# Patient Record
Sex: Male | Born: 1960 | Race: White | Hispanic: No | Marital: Married | Smoking: Former smoker
Health system: Southern US, Community
[De-identification: ages and names within clinical notes are randomized; demographics above are authoritative.]

## PROBLEM LIST (undated history)

## (undated) DIAGNOSIS — I10 Essential (primary) hypertension: Secondary | ICD-10-CM

## (undated) DIAGNOSIS — Z87442 Personal history of urinary calculi: Secondary | ICD-10-CM

## (undated) DIAGNOSIS — G629 Polyneuropathy, unspecified: Secondary | ICD-10-CM

## (undated) DIAGNOSIS — H3500 Unspecified background retinopathy: Secondary | ICD-10-CM

## (undated) DIAGNOSIS — N189 Chronic kidney disease, unspecified: Secondary | ICD-10-CM

## (undated) DIAGNOSIS — E119 Type 2 diabetes mellitus without complications: Secondary | ICD-10-CM

## (undated) DIAGNOSIS — I639 Cerebral infarction, unspecified: Secondary | ICD-10-CM

## (undated) DIAGNOSIS — F419 Anxiety disorder, unspecified: Secondary | ICD-10-CM

## (undated) HISTORY — PX: TONSILLECTOMY: SUR1361

## (undated) HISTORY — PX: LITHOTRIPSY: SUR834

---

## 1997-08-31 HISTORY — PX: SHOULDER ARTHROSCOPY: SHX128

## 1999-10-07 ENCOUNTER — Encounter: Payer: Self-pay | Admitting: Orthopaedic Surgery

## 1999-10-07 ENCOUNTER — Ambulatory Visit (HOSPITAL_COMMUNITY): Admission: RE | Admit: 1999-10-07 | Discharge: 1999-10-07 | Payer: Self-pay | Admitting: Orthopaedic Surgery

## 2014-10-29 NOTE — Patient Instructions (Signed)
Your procedure is scheduled on: 11/05/2014  [facility] to [facility] at  615  AM.  Call this number if you have problems the morning of surgery: [phone]   Do not eat food or drink liquids :After Midnight.      Take these medicines the morning of surgery with A SIP OF WATER: valium, lisinopril, metoprolol   Do not wear jewelry, make-up or nail polish.  Do not wear lotions, powders, or perfumes.  Do not shave 48 hours prior to surgery.  Do not bring valuables to the hospital.  Contacts, dentures or bridgework may not be worn into surgery.  Leave suitcase in the car. After surgery it may be brought to your room.  For patients admitted to the hospital, checkout time is 11:00 AM the day of discharge.   Patients discharged the day of surgery will not be allowed to drive home.  :     Please read over the following fact sheets that you were given: Coughing and Deep Breathing, Surgical Site Infection Prevention, Anesthesia Post-op Instructions and Care and Recovery After Surgery    Cataract A cataract is a clouding of the lens of the eye. When a lens becomes cloudy, vision is reduced based on the degree and nature of the clouding. Many cataracts reduce vision to some degree. Some cataracts make people more near-sighted as they develop. Other cataracts increase glare. Cataracts that are ignored and become worse can sometimes look white. The white color can be seen through the pupil. CAUSES   Aging. However, cataracts may occur at any age, even in newborns.   Certain drugs.   Trauma to the eye.   Certain diseases such as diabetes.   Specific eye diseases such as chronic inflammation inside the eye or a sudden attack of a rare form of glaucoma.   Inherited or acquired medical problems.  SYMPTOMS   Gradual, progressive drop in vision in the affected eye.   Severe, rapid visual loss. This most often happens when trauma is the cause.  DIAGNOSIS  To detect a cataract, an eye doctor examines  the lens. Cataracts are best diagnosed with an exam of the eyes with the pupils enlarged (dilated) by drops.  TREATMENT  For an early cataract, vision may improve by using different eyeglasses or stronger lighting. If that does not help your vision, surgery is the only effective treatment. A cataract needs to be surgically removed when vision loss interferes with your everyday activities, such as driving, reading, or watching TV. A cataract may also have to be removed if it prevents examination or treatment of another eye problem. Surgery removes the cloudy lens and usually replaces it with a substitute lens (intraocular lens, IOL).  At a time when both you and your doctor agree, the cataract will be surgically removed. If you have cataracts in both eyes, only one is usually removed at a time. This allows the operated eye to heal and be out of danger from any possible problems after surgery (such as infection or poor wound healing). In rare cases, a cataract may be doing damage to your eye. In these cases, your caregiver may advise surgical removal right away. The vast majority of people who have cataract surgery have better vision afterward. HOME CARE INSTRUCTIONS  If you are not planning surgery, you may be asked to do the following:  Use different eyeglasses.   Use stronger or brighter lighting.   Ask your eye doctor about reducing your medicine dose or changing medicines  if it is thought that a medicine caused your cataract. Changing medicines does not make the cataract go away on its own.   Become familiar with your surroundings. Poor vision can lead to injury. Avoid bumping into things on the affected side. You are at a higher risk for tripping or falling.   Exercise extreme care when driving or operating machinery.   Wear sunglasses if you are sensitive to bright light or experiencing problems with glare.  SEEK IMMEDIATE MEDICAL CARE IF:   You have a worsening or sudden vision loss.    You notice redness, swelling, or increasing pain in the eye.   You have a fever.  Document Released: 08/17/2005 Document Revised: 08/06/2011 Document Reviewed: 04/10/2011 [facility] Patient Information 2012 [place].PATIENT INSTRUCTIONS POST-ANESTHESIA  IMMEDIATELY FOLLOWING SURGERY:  Do not drive or operate machinery for the first twenty four hours after surgery.  Do not make any important decisions for twenty four hours after surgery or while taking narcotic pain medications or sedatives.  If you develop intractable nausea and vomiting or a severe headache please notify your doctor immediately.  FOLLOW-UP:  Please make an appointment with your surgeon as instructed. You do not need to follow up with anesthesia unless specifically instructed to do so.  WOUND CARE INSTRUCTIONS (if applicable):  Keep a dry clean dressing on the anesthesia/puncture wound site if there is drainage.  Once the wound has quit draining you may leave it open to air.  Generally you should leave the bandage intact for twenty four hours unless there is drainage.  If the epidural site drains for more than 36-48 hours please call the anesthesia department.  QUESTIONS?:  Please feel free to call your physician or the hospital operator if you have any questions, and they will be happy to assist you.

## 2014-10-30 ENCOUNTER — Encounter (HOSPITAL_COMMUNITY): Payer: Self-pay

## 2014-10-30 ENCOUNTER — Encounter (HOSPITAL_COMMUNITY)
Admission: RE | Admit: 2014-10-30 | Discharge: 2014-10-30 | Disposition: A | Discharge status: Home / Self Care | Source: Ambulatory Visit | Attending: Ophthalmology | Admitting: Ophthalmology

## 2014-10-30 ENCOUNTER — Other Ambulatory Visit: Payer: Self-pay

## 2014-10-30 DIAGNOSIS — Z539 Procedure and treatment not carried out, unspecified reason: Secondary | ICD-10-CM | POA: Insufficient documentation

## 2014-10-30 DIAGNOSIS — Z01810 Encounter for preprocedural cardiovascular examination: Secondary | ICD-10-CM | POA: Insufficient documentation

## 2014-10-30 HISTORY — DX: Unspecified background retinopathy: H35.00

## 2014-10-30 HISTORY — DX: Essential (primary) hypertension: I10

## 2014-10-30 HISTORY — DX: Anxiety disorder, unspecified: F41.9

## 2014-10-30 HISTORY — DX: Personal history of urinary calculi: Z87.442

## 2014-10-30 NOTE — Pre-Procedure Instructions (Signed)
Patient given information to sign up for my chart at home. 

## 2014-11-01 NOTE — Pre-Procedure Instructions (Addendum)
[facility] was [facility] with a HGB A1C of 10.6.  Patient is on medication for this.  Discussed with Dr Jayme CloudGonzalez., who advised to contact Dr Lita MainsHaines with above information.  Per Dr Lita MainsHaines, he will contact his medical doctor and will proceed with surgery with a glucose to be done the day of procedure.

## 2014-11-05 ENCOUNTER — Encounter (HOSPITAL_COMMUNITY): Admission: RE | Payer: Self-pay | Source: Ambulatory Visit

## 2014-11-05 ENCOUNTER — Ambulatory Visit (HOSPITAL_COMMUNITY): Admission: RE | Admit: 2014-11-05 | Source: Ambulatory Visit | Admitting: Ophthalmology

## 2014-11-05 SURGERY — PHACOEMULSIFICATION, CATARACT, WITH IOL INSERTION
Anesthesia: Monitor Anesthesia Care | Laterality: Left

## 2014-11-29 ENCOUNTER — Encounter (HOSPITAL_COMMUNITY)
Admission: RE | Admit: 2014-11-29 | Discharge: 2014-11-29 | Disposition: A | Discharge status: Home / Self Care | Source: Ambulatory Visit | Attending: Ophthalmology | Admitting: Ophthalmology

## 2014-11-29 ENCOUNTER — Encounter (HOSPITAL_COMMUNITY): Payer: Self-pay

## 2014-12-03 ENCOUNTER — Ambulatory Visit (HOSPITAL_COMMUNITY): Admitting: Anesthesiology

## 2014-12-03 ENCOUNTER — Ambulatory Visit (HOSPITAL_COMMUNITY)
Admission: RE | Admit: 2014-12-03 | Discharge: 2014-12-03 | Disposition: A | Discharge status: Home / Self Care | Source: Ambulatory Visit | Attending: Ophthalmology | Admitting: Ophthalmology

## 2014-12-03 ENCOUNTER — Encounter (HOSPITAL_COMMUNITY): Payer: Self-pay

## 2014-12-03 ENCOUNTER — Encounter (HOSPITAL_COMMUNITY)
Admission: RE | Disposition: A | Discharge status: Home / Self Care | Payer: Self-pay | Source: Ambulatory Visit | Attending: Ophthalmology

## 2014-12-03 DIAGNOSIS — Z87891 Personal history of nicotine dependence: Secondary | ICD-10-CM | POA: Insufficient documentation

## 2014-12-03 DIAGNOSIS — E119 Type 2 diabetes mellitus without complications: Secondary | ICD-10-CM | POA: Diagnosis not present

## 2014-12-03 DIAGNOSIS — I10 Essential (primary) hypertension: Secondary | ICD-10-CM | POA: Insufficient documentation

## 2014-12-03 DIAGNOSIS — F419 Anxiety disorder, unspecified: Secondary | ICD-10-CM | POA: Diagnosis not present

## 2014-12-03 DIAGNOSIS — H2512 Age-related nuclear cataract, left eye: Secondary | ICD-10-CM | POA: Insufficient documentation

## 2014-12-03 HISTORY — PX: CATARACT EXTRACTION W/PHACO: SHX586

## 2014-12-03 HISTORY — DX: Type 2 diabetes mellitus without complications: E11.9

## 2014-12-03 LAB — GLUCOSE, CAPILLARY: Glucose-Capillary: 154 mg/dL — ABNORMAL HIGH (ref 70–99)

## 2014-12-03 SURGERY — PHACOEMULSIFICATION, CATARACT, WITH IOL INSERTION
Anesthesia: Monitor Anesthesia Care | Site: Eye | Laterality: Left

## 2014-12-03 MED ORDER — MIDAZOLAM HCL 2 MG/2ML IJ SOLN
INTRAMUSCULAR | Status: AC
  Filled 2014-12-03: qty 2

## 2014-12-03 MED ORDER — MIDAZOLAM HCL 5 MG/5ML IJ SOLN
INTRAMUSCULAR | Status: DC | PRN
  Administered 2014-12-03: 1 mg via INTRAVENOUS
  Administered 2014-12-03: 2 mg via INTRAVENOUS
  Administered 2014-12-03: 1 mg via INTRAVENOUS

## 2014-12-03 MED ORDER — PHENYLEPHRINE-KETOROLAC 1-0.3 % IO SOLN
INTRAOCULAR | Status: DC | PRN
  Administered 2014-12-03: 500 mL via OPHTHALMIC

## 2014-12-03 MED ORDER — LABETALOL HCL 5 MG/ML IV SOLN
10.0000 mg | Freq: Once | INTRAVENOUS | Status: AC
  Administered 2014-12-03 (×2): 5 mg via INTRAVENOUS

## 2014-12-03 MED ORDER — POVIDONE-IODINE 5 % OP SOLN
OPHTHALMIC | Status: DC | PRN
  Administered 2014-12-03: 1 via OPHTHALMIC

## 2014-12-03 MED ORDER — LABETALOL HCL 5 MG/ML IV SOLN
INTRAVENOUS | Status: AC
  Filled 2014-12-03: qty 4

## 2014-12-03 MED ORDER — LIDOCAINE HCL 3.5 % OP GEL
OPHTHALMIC | Status: DC | PRN
  Administered 2014-12-03: 1 via OPHTHALMIC

## 2014-12-03 MED ORDER — TRYPAN BLUE 0.06 % OP SOLN
OPHTHALMIC | Status: DC | PRN
  Administered 2014-12-03: .25 mL via INTRAOCULAR

## 2014-12-03 MED ORDER — FENTANYL CITRATE 0.05 MG/ML IJ SOLN
25.0000 ug | INTRAMUSCULAR | Status: AC
Start: 2014-12-03 — End: 2014-12-03
  Administered 2014-12-03 (×2): 25 ug via INTRAVENOUS

## 2014-12-03 MED ORDER — LACTATED RINGERS IV SOLN
INTRAVENOUS | Status: DC
  Administered 2014-12-03: 08:00:00 via INTRAVENOUS

## 2014-12-03 MED ORDER — TETRACAINE HCL 0.5 % OP SOLN
OPHTHALMIC | Status: AC
  Filled 2014-12-03: qty 2

## 2014-12-03 MED ORDER — FENTANYL CITRATE 0.05 MG/ML IJ SOLN
INTRAMUSCULAR | Status: AC
  Filled 2014-12-03: qty 2

## 2014-12-03 MED ORDER — TRYPAN BLUE 0.06 % OP SOLN
OPHTHALMIC | Status: AC
  Filled 2014-12-03: qty 0.5

## 2014-12-03 MED ORDER — CYCLOPENTOLATE-PHENYLEPHRINE OP SOLN OPTIME - NO CHARGE
OPHTHALMIC | Status: AC
  Filled 2014-12-03: qty 2

## 2014-12-03 MED ORDER — TETRACAINE 0.5 % OP SOLN OPTIME - NO CHARGE
OPHTHALMIC | Status: DC | PRN
  Administered 2014-12-03: 1 [drp] via OPHTHALMIC

## 2014-12-03 MED ORDER — MIDAZOLAM HCL 2 MG/2ML IJ SOLN
1.0000 mg | INTRAMUSCULAR | Status: DC | PRN
  Administered 2014-12-03: 2 mg via INTRAVENOUS
  Filled 2014-12-03: qty 2

## 2014-12-03 MED ORDER — NA HYALUR & NA CHOND-NA HYALUR 0.55-0.5 ML IO KIT
PACK | INTRAOCULAR | Status: DC | PRN
  Administered 2014-12-03: 1 via OPHTHALMIC

## 2014-12-03 MED ORDER — BSS IO SOLN
INTRAOCULAR | Status: DC | PRN
  Administered 2014-12-03: 15 mL via INTRAOCULAR

## 2014-12-03 MED ORDER — LIDOCAINE HCL 3.5 % OP GEL
OPHTHALMIC | Status: AC
  Filled 2014-12-03: qty 1

## 2014-12-03 SURGICAL SUPPLY — 8 items
CLOTH BEACON ORANGE TIMEOUT ST (SAFETY) ×1 IMPLANT
GLOVE BIOGEL PI IND STRL 6.5 (GLOVE) IMPLANT
GLOVE BIOGEL PI INDICATOR 6.5 (GLOVE) ×1
GLOVE EXAM NITRILE MD LF STRL (GLOVE) ×1 IMPLANT
INST SET CATARACT ~~LOC~~ (KITS) ×2 IMPLANT
LENS ALC ACRYL/TECN (Ophthalmic Related) ×2 IMPLANT
PAD ARMBOARD 7.5X6 YLW CONV (MISCELLANEOUS) ×1 IMPLANT
WATER STERILE IRR 250ML POUR (IV SOLUTION) ×1 IMPLANT

## 2014-12-03 NOTE — Anesthesia Preprocedure Evaluation (Signed)
Anesthesia Evaluation  Patient identified by MRN, date of birth, ID band Patient awake    Reviewed: Allergy & Precautions, NPO status , Patient's Chart, lab work & pertinent test results  Airway Mallampati: I  TM Distance: <3 FB Neck ROM: Full    Dental  (+) Teeth Intact   Pulmonary former smoker,  breath sounds clear to auscultation        Cardiovascular hypertension, Pt. on medications Rhythm:Regular Rate:Normal     Neuro/Psych PSYCHIATRIC DISORDERS Anxiety    GI/Hepatic   Endo/Other  diabetes, Type 2, Oral Hypoglycemic Agents  Renal/GU      Musculoskeletal   Abdominal   Peds  Hematology   Anesthesia Other Findings   Reproductive/Obstetrics                             Anesthesia Physical Anesthesia Plan  ASA: III  Anesthesia Plan: MAC   Post-op Pain Management:    Induction: Intravenous  Airway Management Planned: Nasal Cannula  Additional Equipment:   Intra-op Plan:   Post-operative Plan:   Informed Consent: I have reviewed the patients History and Physical, chart, labs and discussed the procedure including the risks, benefits and alternatives for the proposed anesthesia with the patient or authorized representative who has indicated his/her understanding and acceptance.     Plan Discussed with:   Anesthesia Plan Comments:         Anesthesia Quick Evaluation

## 2014-12-03 NOTE — Transfer of Care (Signed)
Immediate Anesthesia Transfer of Care Note  Patient: Jeff Silva  Procedure(s) Performed: Procedure(s) with comments: CATARACT EXTRACTION PHACO AND INTRAOCULAR LENS PLACEMENT (IOC) (Left) - CDE:23.07  Patient Location: Short Stay  Anesthesia Type:MAC  Level of Consciousness: awake, alert , oriented and patient cooperative  Airway & Oxygen Therapy: Patient Spontanous Breathing  Post-op Assessment: Report given to RN, Post -op Vital signs reviewed and stable and Patient moving all extremities  Post vital signs: Reviewed and stable  Last Vitals:  Filed Vitals:   12/03/14 0825  BP: 199/95  Pulse:   Temp:   Resp: 0    Complications: No apparent anesthesia complications

## 2014-12-03 NOTE — Op Note (Signed)
12/03/2014  9:14 AM  PATIENT:  Jeff Silva  54 y.o. male  PRE-OPERATIVE DIAGNOSIS:  nuclear cataract left eye  POST-OPERATIVE DIAGNOSIS:  nuclear cataract left eye  PROCEDURE:  Procedure(s): CATARACT EXTRACTION PHACO AND INTRAOCULAR LENS PLACEMENT (IOC)  SURGEON:  Surgeon(s): Susa Simmondsarroll F Monaca Wadas, MD  ASSISTANTS:   Marya LandryMaggie Henderson, CST   ANESTHESIA STAFF: Anesthesiologist: Laurene FootmanLuis Gonzalez, MD CRNA: Despina Hiddenobert J Idacavage, CRNA  ANESTHESIA:   topical and MAC  REQUESTED LENS POWER: 24.0  LENS IMPLANT INFORMATION:  Alcon SN60WF   S/n [id number]  Exp 01/2019  CUMULATIVE DISSIPATED ENERGY:23.07  INDICATIONS:see scanned office H&P  OP FINDINGS:dense NS/cortical  COMPLICATIONS:None  PROCEDURE:  The patient was brought to the operating room in good condition.  The operative eye was prepped and draped in the usual fashion for intraocular surgery.  Lidocaine gel was dropped onto the eye.  A 2.4 mm 10 O'clock near clear corneal stepped incision and a 12 O'clock stab incision were created.  Viscoat was instilled into the anterior chamber.  The 5 mm anterior capsulorhexis was performed with a bent needle cystotome and Utrata forceps.  The lens was hydrodissected and hydrodelineated with a cannula and balanced salt solution and rotated with a Kuglen hook.  Phacoemulsification was perfomed in the divide and conquer technique.  The remaining cortex was removed with I&A and the capsular surfaces polished as necessary.  Provisc was placed into the capsular bag and the lens inserted with the Alcon inserter.  The viscoelastic was removed with I&A and the lens "rocked" into position.  The wounds were hydrated and te anterior chamber was refilled with balanced salt solution.  The wounds were checked for leakage and rehydrated as necessary.  The lid speculum and drapes were removed and the patient was transported to short stay in good condition.

## 2014-12-03 NOTE — Brief Op Note (Signed)
12/03/2014  9:14 AM  PATIENT:  Jeff Silva  54 y.o. male  PRE-OPERATIVE DIAGNOSIS:  nuclear cataract left eye  POST-OPERATIVE DIAGNOSIS:  nuclear cataract left eye  PROCEDURE:  Procedure(s): CATARACT EXTRACTION PHACO AND INTRAOCULAR LENS PLACEMENT (IOC)  SURGEON:  Surgeon(s): Susa Simmondsarroll F Tylisha Danis, MD  ASSISTANTS:   Marya LandryMaggie Henderson, CST   ANESTHESIA STAFF: Anesthesiologist: Laurene FootmanLuis Gonzalez, MD CRNA: Despina Hiddenobert J Idacavage, CRNA  ANESTHESIA:   topical and MAC  REQUESTED LENS POWER: 24.0  LENS IMPLANT INFORMATION:  Alcon SN60WF   S/n [id number]  Exp 01/2019  CUMULATIVE DISSIPATED ENERGY:23.07  INDICATIONS:see scanned office H&P  OP FINDINGS:dense NS/cortical  COMPLICATIONS:None  DICTATION #: none  PLAN OF CARE: as above  PATIENT DISPOSITION:  Short Stay

## 2014-12-03 NOTE — Anesthesia Postprocedure Evaluation (Signed)
  Anesthesia Post-op Note  Patient: Jeff Silva  Procedure(s) Performed: Procedure(s) with comments: CATARACT EXTRACTION PHACO AND INTRAOCULAR LENS PLACEMENT (IOC) (Left) - CDE:23.07  Patient Location: Short Stay  Anesthesia Type:MAC  Level of Consciousness: awake, alert , oriented and patient cooperative  Airway and Oxygen Therapy: Patient Spontanous Breathing  Post-op Pain: none  Post-op Assessment: Post-op Vital signs reviewed, Patient's Cardiovascular Status Stable, Respiratory Function Stable and Patent Airway  Post-op Vital Signs: Reviewed and stable  Last Vitals:  Filed Vitals:   12/03/14 0825  BP: 199/95  Pulse:   Temp:   Resp: 0    Complications: No apparent anesthesia complications

## 2014-12-03 NOTE — H&P (Signed)
I have reviewed the pre printed H&P, the patient was re-examined, and I have identified no significant interval changes in the patient's medical condition.  There is no change in the plan of care since the history and physical of record. 

## 2014-12-03 NOTE — Discharge Instructions (Signed)
Jeff HemanJames Soltis 12/03/2014 Dr. Lita MainsHaines Post operative Instructions for Cataract Patients  These instructions are for Jeff Silva and pertain to the operative eye.  1.  Resume your normal diet and previous oral medicines.  2. Your Follow-up appointment is at Dr. Lita MainsHaines' office in [place] on 12/04/14 @10 :45  3. You may leave the hospital when your driver is present and your nurse releases you.  4. Begin Pred Forte (prednisolone acetate 1%), Acular LS (ketorolac tromethamine .4%) and Gatifloxacin 0.5% eye drops; 1 drop each 4 times daily to operative eye. Begin when you are discharged to home Moxifloxacin 0.5% may be substituted for Gatifloxacin using the same instructions.  5. Page Dr. Lita MainsHaines via beeper [phone] for significant pain in or around operative eye that is not relieved by Tylenol.  6. If you took Plavix before surgery, restart it at the usual dose on the evening of surgery.  7. Wear dark glasses as necessary for excessive light sensitivity.  8. Do no forcefully rub you your operative eye.  9. Keep your operative eye dry for 1 week. You may gently clean your eyelids with a damp washcloth.  10. You may resume normal occupational activities in one week and resume driving as tolerated after the first post operative visit.  11. It is normal to have blurred vision and a scratchy sensation following surgery.  Dr. Lita MainsHaines: [phone]

## 2014-12-05 ENCOUNTER — Encounter (HOSPITAL_COMMUNITY): Payer: Self-pay | Admitting: Ophthalmology

## 2015-06-07 NOTE — Patient Instructions (Signed)
Jeff Silva  06/07/2015     @   Your procedure is scheduled on 06/17/2015.  Report to Jeff Silva at 7:45 A.M.  Call this number if you have problems the morning of surgery:  [phone]   Remember:  Do not eat food or drink liquids after midnight.  Take these medicines the morning of surgery with A SIP OF WATER Valium, Lisinopril Metoprolol (DONT TAKE METFORMIN AM OF PROCEDURE)   Do not wear jewelry, make-up or nail polish.  Do not wear lotions, powders, or perfumes.  You may wear deodorant.  Do not shave 48 hours prior to surgery.  Men may shave face and neck.  Do not bring valuables to the hospital.  [name] is not responsible for any belongings or valuables.  Contacts, dentures or bridgework may not be worn into surgery.  Leave your suitcase in the car.  After surgery it may be brought to your room.  For patients admitted to the hospital, discharge time will be determined by your treatment team.  Patients discharged the day of surgery will not be allowed to drive home.    Please read over the following fact sheets that you were given. Anesthesia Post-op Instructions     PATIENT INSTRUCTIONS POST-ANESTHESIA  IMMEDIATELY FOLLOWING SURGERY:  Do not drive or operate machinery for the first twenty four hours after surgery.  Do not make any important decisions for twenty four hours after surgery or while taking narcotic pain medications or sedatives.  If you develop intractable nausea and vomiting or a severe headache please notify your doctor immediately.  FOLLOW-UP:  Please make an appointment with your surgeon as instructed. You do not need to follow up with anesthesia unless specifically instructed to do so.  WOUND CARE INSTRUCTIONS (if applicable):  Keep a dry clean dressing on the anesthesia/puncture wound site if there is drainage.  Once the wound has quit draining you may leave it open to air.  Generally you should leave the bandage intact for  twenty four hours unless there is drainage.  If the epidural site drains for more than 36-48 hours please call the anesthesia department.  QUESTIONS?:  Please feel free to call your physician or the hospital operator if you have any questions, and they will be happy to assist you.       A cataract is a clouding of the lens of the eye. When a lens becomes cloudy, vision is reduced based on the degree and nature of the clouding. Surgery may be needed to improve vision. Surgery removes the cloudy lens and usually replaces it with a substitute lens (intraocular lens, IOL). LET YOUR EYE DOCTOR KNOW ABOUT:  Allergies to food or medicine.  Medicines taken including herbs, eye drops, over-the-counter medicines, and creams.  Use of steroids (by mouth or creams).  Previous problems with anesthetics or numbing medicine.  History of bleeding problems or blood clots.  Previous surgery.  Other health problems, including diabetes and kidney problems.  Possibility of pregnancy, if this applies. RISKS AND COMPLICATIONS  Infection.  Inflammation of the eyeball (endophthalmitis) that can spread to both eyes (sympathetic ophthalmia).  Poor wound healing.  If an IOL is inserted, it can later fall out of proper position. This is very uncommon.  Clouding of the part of your eye that holds an IOL in place. This is called an "after-cataract." These are uncommon but easily treated. BEFORE THE PROCEDURE  Do not eat or drink anything except small amounts of water for  8 to 12 before your surgery, or as directed by your caregiver.  Unless you are told otherwise, continue any eye drops you have been prescribed.  Talk to your primary caregiver about all other medicines that you take (both prescription and nonprescription). In some cases, you may need to stop or change medicines near the time of your surgery. This is most important if you are taking blood-thinning medicine.Do not stop medicines unless you  are told to do so.  Arrange for someone to drive you to and from the procedure.  Do not put contact lenses in either eye on the day of your surgery. PROCEDURE There is more than one method for safely removing a cataract. Your doctor can explain the differences and help determine which is best for you. Phacoemulsification surgery is the most common form of cataract surgery.  An injection is given behind the eye or eye drops are given to make this a painless procedure.  A small cut (incision) is made on the edge of the clear, dome-shaped surface that covers the front of the eye (cornea).  A tiny probe is painlessly inserted into the eye. This device gives off ultrasound waves that soften and break up the cloudy center of the lens. This makes it easier for the cloudy lens to be removed by suction.  An IOL may be implanted.  The normal lens of the eye is covered by a clear capsule. Part of that capsule is intentionally left in the eye to support the IOL.  Your surgeon may or may not use stitches to close the incision. There are other forms of cataract surgery that require a larger incision and stitches to close the eye. This approach is taken in cases where the doctor feels that the cataract cannot be easily removed using phacoemulsification. AFTER THE PROCEDURE  When an IOL is implanted, it does not need care. It becomes a permanent part of your eye and cannot be seen or felt.  Your doctor will schedule follow-up exams to check on your progress.  Review your other medicines with your doctor to see which can be resumed after surgery.  Use eye drops or take medicine as prescribed by your doctor.   This information is not intended to replace advice given to you by your health care provider. Make sure you discuss any questions you have with your health care provider.   Document Released: 08/06/2011 Document Revised: 09/07/2014 Document Reviewed: 08/06/2011 Elsevier Interactive Patient  Education [employer]

## 2015-06-11 ENCOUNTER — Encounter (HOSPITAL_COMMUNITY)
Admission: RE | Admit: 2015-06-11 | Discharge: 2015-06-11 | Disposition: A | Discharge status: Home / Self Care | Source: Ambulatory Visit | Attending: Ophthalmology | Admitting: Ophthalmology

## 2015-06-11 ENCOUNTER — Encounter (HOSPITAL_COMMUNITY): Payer: Self-pay

## 2015-06-11 DIAGNOSIS — Z01818 Encounter for other preprocedural examination: Secondary | ICD-10-CM | POA: Diagnosis not present

## 2015-06-11 HISTORY — DX: Polyneuropathy, unspecified: G62.9

## 2015-06-11 LAB — CBC WITH DIFFERENTIAL/PLATELET
BASOS PCT: 2 %
Basophils Absolute: 0.2 10*3/uL — ABNORMAL HIGH (ref 0.0–0.1)
EOS ABS: 1.3 10*3/uL — AB (ref 0.0–0.7)
EOS PCT: 12 %
HCT: 33.5 % — ABNORMAL LOW (ref 39.0–52.0)
HEMOGLOBIN: 11.5 g/dL — AB (ref 13.0–17.0)
LYMPHS ABS: 2.1 10*3/uL (ref 0.7–4.0)
Lymphocytes Relative: 19 %
MCH: 31.7 pg (ref 26.0–34.0)
MCHC: 34.3 g/dL (ref 30.0–36.0)
MCV: 92.3 fL (ref 78.0–100.0)
Monocytes Absolute: 0.5 10*3/uL (ref 0.1–1.0)
Monocytes Relative: 4 %
NEUTROS PCT: 63 %
Neutro Abs: 7 10*3/uL (ref 1.7–7.7)
PLATELETS: 310 10*3/uL (ref 150–400)
RBC: 3.63 MIL/uL — AB (ref 4.22–5.81)
RDW: 12.7 % (ref 11.5–15.5)
WBC: 11.1 10*3/uL — AB (ref 4.0–10.5)

## 2015-06-11 LAB — BASIC METABOLIC PANEL
Anion gap: 8 (ref 5–15)
BUN: 45 mg/dL — AB (ref 6–20)
CHLORIDE: 103 mmol/L (ref 101–111)
CO2: 25 mmol/L (ref 22–32)
Calcium: 9.1 mg/dL (ref 8.9–10.3)
Creatinine, Ser: 1.43 mg/dL — ABNORMAL HIGH (ref 0.61–1.24)
GFR, EST NON AFRICAN AMERICAN: 55 mL/min — AB (ref >60)
Glucose, Bld: 124 mg/dL — ABNORMAL HIGH (ref 65–99)
Potassium: 5.5 mmol/L — ABNORMAL HIGH (ref 3.5–5.1)
SODIUM: 136 mmol/L (ref 135–145)

## 2015-06-11 NOTE — Pre-Procedure Instructions (Signed)
Patient given information to sign up for my chart at home. 

## 2015-06-11 NOTE — Pre-Procedure Instructions (Signed)
Received Lab results. Potassium 5.5 and BUN 45, creat 1.43. Labs in March of 2016 show BUN 21 and creat 1.04, potassium 5.1. Patient was placed on Metformin for diabetes in in April of 2016. Called Dr Lita Mains and he wants PCP called about labs. [facility] and they wanted me to fax labs over. Faxed labs to [phone]. Copy in chart. Will Talk to Dr Jayme Cloud in morning about above information.

## 2015-06-12 NOTE — Pre-Procedure Instructions (Signed)
Dr Gonzalez aware of Jeff Silva. We will repeat on arrival that am and call [facility] later today or tomorrow to see what course of treatment they are taking for patient.

## 2015-06-13 NOTE — Pre-Procedure Instructions (Signed)
Spoke with Lucendia HerrlichFaye at [facility]. She contacted patient and said that he states he had spoken with his nephrologist and is to see him next week. I will consult Dr Jayme CloudGonzalez in am.

## 2015-06-13 NOTE — Pre-Procedure Instructions (Signed)
Called [facility] to see what course of treatment was being taken for elevated BUN and potassium. Crystal states she will send a note to the PA and have him call us back.

## 2015-06-14 NOTE — Pre-Procedure Instructions (Addendum)
Spoke with Dr Jayme CloudGonzalez about patient's upcoming nephrology appointment and that [facility] was deferring treatment to nephrologist. He wants patient to been seen prior to surgery. Dr Lita MainsHaines notified about this and procedure was cancelled for now. Called patient and left message for him that when he was seen by nephrologist and treated to call Dr Lita MainsHaines office back and have procedure rescheduled.

## 2015-06-17 ENCOUNTER — Encounter (HOSPITAL_COMMUNITY): Admission: RE | Payer: Self-pay | Source: Ambulatory Visit

## 2015-06-17 ENCOUNTER — Ambulatory Visit (HOSPITAL_COMMUNITY): Admission: RE | Admit: 2015-06-17 | Source: Ambulatory Visit | Admitting: Ophthalmology

## 2015-06-17 SURGERY — PHACOEMULSIFICATION, CATARACT, WITH IOL INSERTION
Anesthesia: Monitor Anesthesia Care | Laterality: Right

## 2016-01-28 ENCOUNTER — Other Ambulatory Visit (HOSPITAL_COMMUNITY): Payer: Self-pay | Admitting: Medical

## 2016-01-28 DIAGNOSIS — N183 Chronic kidney disease, stage 3 (moderate): Secondary | ICD-10-CM

## 2016-02-11 ENCOUNTER — Ambulatory Visit (HOSPITAL_COMMUNITY): Payer: Self-pay

## 2016-04-13 NOTE — Patient Instructions (Signed)
Your procedure is scheduled on: 04/20/2016   Report to [facility] at  800  AM.  Call this number if you have problems the morning of surgery: [phone]   Do not eat food or drink liquids :After Midnight.      Take these medicines the morning of surgery with A SIP OF WATER: xanax, hydrodiuril.   Do not wear jewelry, make-up or nail polish.  Do not wear lotions, powders, or perfumes. You may wear deodorant.  Do not shave 48 hours prior to surgery.  Do not bring valuables to the hospital.  Contacts, dentures or bridgework may not be worn into surgery.  Leave suitcase in the car. After surgery it may be brought to your room.  For patients admitted to the hospital, checkout time is 11:00 AM the day of discharge.   Patients discharged the day of surgery will not be allowed to drive home.  :     Please read over the following fact sheets that you were given: Coughing and Deep Breathing, Surgical Site Infection Prevention, Anesthesia Post-op Instructions and Care and Recovery After Surgery    Cataract A cataract is a clouding of the lens of the eye. When a lens becomes cloudy, vision is reduced based on the degree and nature of the clouding. Many cataracts reduce vision to some degree. Some cataracts make people more near-sighted as they develop. Other cataracts increase glare. Cataracts that are ignored and become worse can sometimes look white. The white color can be seen through the pupil. CAUSES   Aging. However, cataracts may occur at any age, even in newborns.   Certain drugs.   Trauma to the eye.   Certain diseases such as diabetes.   Specific eye diseases such as chronic inflammation inside the eye or a sudden attack of a rare form of glaucoma.   Inherited or acquired medical problems.  SYMPTOMS   Gradual, progressive drop in vision in the affected eye.   Severe, rapid visual loss. This most often happens when trauma is the cause.  DIAGNOSIS  To detect a cataract, an eye  doctor examines the lens. Cataracts are best diagnosed with an exam of the eyes with the pupils enlarged (dilated) by drops.  TREATMENT  For an early cataract, vision may improve by using different eyeglasses or stronger lighting. If that does not help your vision, surgery is the only effective treatment. A cataract needs to be surgically removed when vision loss interferes with your everyday activities, such as driving, reading, or watching TV. A cataract may also have to be removed if it prevents examination or treatment of another eye problem. Surgery removes the cloudy lens and usually replaces it with a substitute lens (intraocular lens, IOL).  At a time when both you and your doctor agree, the cataract will be surgically removed. If you have cataracts in both eyes, only one is usually removed at a time. This allows the operated eye to heal and be out of danger from any possible problems after surgery (such as infection or poor wound healing). In rare cases, a cataract may be doing damage to your eye. In these cases, your caregiver may advise surgical removal right away. The vast majority of people who have cataract surgery have better vision afterward. HOME CARE INSTRUCTIONS  If you are not planning surgery, you may be asked to do the following:  Use different eyeglasses.   Use stronger or brighter lighting.   Ask your eye doctor about reducing your medicine  dose or changing medicines if it is thought that a medicine caused your cataract. Changing medicines does not make the cataract go away on its own.   Become familiar with your surroundings. Poor vision can lead to injury. Avoid bumping into things on the affected side. You are at a higher risk for tripping or falling.   Exercise extreme care when driving or operating machinery.   Wear sunglasses if you are sensitive to bright light or experiencing problems with glare.  SEEK IMMEDIATE MEDICAL CARE IF:   You have a worsening or sudden  vision loss.   You notice redness, swelling, or increasing pain in the eye.   You have a fever.  Document Released: 08/17/2005 Document Revised: 08/06/2011 Document Reviewed: 04/10/2011 [facility] Patient Information 2012 [place].PATIENT INSTRUCTIONS POST-ANESTHESIA  IMMEDIATELY FOLLOWING SURGERY:  Do not drive or operate machinery for the first twenty four hours after surgery.  Do not make any important decisions for twenty four hours after surgery or while taking narcotic pain medications or sedatives.  If you develop intractable nausea and vomiting or a severe headache please notify your doctor immediately.  FOLLOW-UP:  Please make an appointment with your surgeon as instructed. You do not need to follow up with anesthesia unless specifically instructed to do so.  WOUND CARE INSTRUCTIONS (if applicable):  Keep a dry clean dressing on the anesthesia/puncture wound site if there is drainage.  Once the wound has quit draining you may leave it open to air.  Generally you should leave the bandage intact for twenty four hours unless there is drainage.  If the epidural site drains for more than 36-48 hours please call the anesthesia department.  QUESTIONS?:  Please feel free to call your physician or the hospital operator if you have any questions, and they will be happy to assist you.

## 2016-04-14 ENCOUNTER — Encounter (HOSPITAL_COMMUNITY): Payer: Self-pay

## 2016-04-14 ENCOUNTER — Encounter (HOSPITAL_COMMUNITY)
Admission: RE | Admit: 2016-04-14 | Discharge: 2016-04-14 | Disposition: A | Discharge status: Home / Self Care | Source: Ambulatory Visit | Attending: Ophthalmology | Admitting: Ophthalmology

## 2016-04-14 DIAGNOSIS — Z01812 Encounter for preprocedural laboratory examination: Secondary | ICD-10-CM | POA: Diagnosis present

## 2016-04-14 DIAGNOSIS — Z01810 Encounter for preprocedural cardiovascular examination: Secondary | ICD-10-CM | POA: Insufficient documentation

## 2016-04-14 DIAGNOSIS — H2511 Age-related nuclear cataract, right eye: Secondary | ICD-10-CM | POA: Diagnosis not present

## 2016-04-14 HISTORY — DX: Chronic kidney disease, unspecified: N18.9

## 2016-04-14 LAB — CBC WITH DIFFERENTIAL/PLATELET
BASOS PCT: 1 %
Basophils Absolute: 0.1 10*3/uL (ref 0.0–0.1)
EOS ABS: 0.3 10*3/uL (ref 0.0–0.7)
Eosinophils Relative: 4 %
HCT: 36.1 % — ABNORMAL LOW (ref 39.0–52.0)
Hemoglobin: 12.6 g/dL — ABNORMAL LOW (ref 13.0–17.0)
Lymphocytes Relative: 25 %
Lymphs Abs: 1.8 10*3/uL (ref 0.7–4.0)
MCH: 30.5 pg (ref 26.0–34.0)
MCHC: 34.9 g/dL (ref 30.0–36.0)
MCV: 87.4 fL (ref 78.0–100.0)
Monocytes Absolute: 0.4 10*3/uL (ref 0.1–1.0)
Monocytes Relative: 5 %
Neutro Abs: 4.5 10*3/uL (ref 1.7–7.7)
Neutrophils Relative %: 65 %
Platelets: 291 10*3/uL (ref 150–400)
RBC: 4.13 MIL/uL — AB (ref 4.22–5.81)
RDW: 12.9 % (ref 11.5–15.5)
WBC: 7 10*3/uL (ref 4.0–10.5)

## 2016-04-14 LAB — BASIC METABOLIC PANEL
ANION GAP: 10 (ref 5–15)
BUN: 46 mg/dL — ABNORMAL HIGH (ref 6–20)
CO2: 25 mmol/L (ref 22–32)
Calcium: 8.6 mg/dL — ABNORMAL LOW (ref 8.9–10.3)
Chloride: 97 mmol/L — ABNORMAL LOW (ref 101–111)
Creatinine, Ser: 2.44 mg/dL — ABNORMAL HIGH (ref 0.61–1.24)
GFR calc Af Amer: 33 mL/min — ABNORMAL LOW (ref >60)
GFR calc non Af Amer: 29 mL/min — ABNORMAL LOW (ref >60)
Glucose, Bld: 241 mg/dL — ABNORMAL HIGH (ref 65–99)
POTASSIUM: 4 mmol/L (ref 3.5–5.1)
SODIUM: 132 mmol/L — AB (ref 135–145)

## 2016-04-14 NOTE — Pre-Procedure Instructions (Signed)
Patient given information to sign up for my chart at home. 

## 2016-04-20 ENCOUNTER — Encounter (HOSPITAL_COMMUNITY): Payer: Self-pay | Admitting: *Deleted

## 2016-04-20 ENCOUNTER — Ambulatory Visit (HOSPITAL_COMMUNITY)
Admission: RE | Admit: 2016-04-20 | Discharge: 2016-04-20 | Disposition: A | Discharge status: Home / Self Care | Source: Ambulatory Visit | Attending: Ophthalmology | Admitting: Ophthalmology

## 2016-04-20 ENCOUNTER — Ambulatory Visit (HOSPITAL_COMMUNITY): Admitting: Anesthesiology

## 2016-04-20 ENCOUNTER — Encounter (HOSPITAL_COMMUNITY)
Admission: RE | Disposition: A | Discharge status: Home / Self Care | Payer: Self-pay | Source: Ambulatory Visit | Attending: Ophthalmology

## 2016-04-20 DIAGNOSIS — Z885 Allergy status to narcotic agent status: Secondary | ICD-10-CM | POA: Insufficient documentation

## 2016-04-20 DIAGNOSIS — I10 Essential (primary) hypertension: Secondary | ICD-10-CM | POA: Insufficient documentation

## 2016-04-20 DIAGNOSIS — Z87891 Personal history of nicotine dependence: Secondary | ICD-10-CM | POA: Insufficient documentation

## 2016-04-20 DIAGNOSIS — D649 Anemia, unspecified: Secondary | ICD-10-CM | POA: Diagnosis not present

## 2016-04-20 DIAGNOSIS — E1136 Type 2 diabetes mellitus with diabetic cataract: Secondary | ICD-10-CM | POA: Diagnosis not present

## 2016-04-20 DIAGNOSIS — F419 Anxiety disorder, unspecified: Secondary | ICD-10-CM | POA: Diagnosis not present

## 2016-04-20 DIAGNOSIS — H2511 Age-related nuclear cataract, right eye: Secondary | ICD-10-CM | POA: Diagnosis present

## 2016-04-20 HISTORY — PX: CATARACT EXTRACTION W/PHACO: SHX586

## 2016-04-20 LAB — GLUCOSE, CAPILLARY: GLUCOSE-CAPILLARY: 144 mg/dL — AB (ref 65–99)

## 2016-04-20 SURGERY — PHACOEMULSIFICATION, CATARACT, WITH IOL INSERTION
Anesthesia: Monitor Anesthesia Care | Site: Eye | Laterality: Right

## 2016-04-20 MED ORDER — MIDAZOLAM HCL 2 MG/2ML IJ SOLN
INTRAMUSCULAR | Status: AC
  Filled 2016-04-20: qty 2

## 2016-04-20 MED ORDER — CYCLOPENTOLATE-PHENYLEPHRINE 0.2-1 % OP SOLN
1.0000 [drp] | OPHTHALMIC | Status: AC
  Administered 2016-04-20 (×3): 1 [drp] via OPHTHALMIC

## 2016-04-20 MED ORDER — ONDANSETRON HCL 4 MG/2ML IJ SOLN
INTRAMUSCULAR | Status: AC
  Filled 2016-04-20: qty 2

## 2016-04-20 MED ORDER — ONDANSETRON HCL 4 MG/2ML IJ SOLN
INTRAMUSCULAR | Status: DC | PRN
  Administered 2016-04-20: 4 mg via INTRAVENOUS

## 2016-04-20 MED ORDER — BSS IO SOLN
INTRAOCULAR | Status: DC | PRN
  Administered 2016-04-20: 15 mL

## 2016-04-20 MED ORDER — NA HYALUR & NA CHOND-NA HYALUR 0.55-0.5 ML IO KIT
PACK | INTRAOCULAR | Status: DC | PRN
  Administered 2016-04-20: 1 via OPHTHALMIC

## 2016-04-20 MED ORDER — LACTATED RINGERS IV SOLN
INTRAVENOUS | Status: DC
  Administered 2016-04-20: 10:00:00 via INTRAVENOUS

## 2016-04-20 MED ORDER — PHENYLEPHRINE-KETOROLAC 1-0.3 % IO SOLN
INTRAOCULAR | Status: DC | PRN
  Administered 2016-04-20: 500 mL via OPHTHALMIC

## 2016-04-20 MED ORDER — TETRACAINE HCL 0.5 % OP SOLN
1.0000 [drp] | OPHTHALMIC | Status: AC
  Administered 2016-04-20 (×3): 1 [drp] via OPHTHALMIC

## 2016-04-20 MED ORDER — TETRACAINE 0.5 % OP SOLN OPTIME - NO CHARGE
OPHTHALMIC | Status: DC | PRN
  Administered 2016-04-20: 1 [drp] via OPHTHALMIC

## 2016-04-20 MED ORDER — POVIDONE-IODINE 5 % OP SOLN
OPHTHALMIC | Status: DC | PRN
  Administered 2016-04-20: 1 via OPHTHALMIC

## 2016-04-20 MED ORDER — LIDOCAINE HCL 3.5 % OP GEL
OPHTHALMIC | Status: DC | PRN
  Administered 2016-04-20: 1 via OPHTHALMIC

## 2016-04-20 MED ORDER — MIDAZOLAM HCL 5 MG/5ML IJ SOLN
INTRAMUSCULAR | Status: DC | PRN
  Administered 2016-04-20: 2 mg via INTRAVENOUS

## 2016-04-20 MED ORDER — FENTANYL CITRATE (PF) 100 MCG/2ML IJ SOLN
INTRAMUSCULAR | Status: DC | PRN
  Administered 2016-04-20 (×2): 50 ug via INTRAVENOUS

## 2016-04-20 MED ORDER — FENTANYL CITRATE (PF) 100 MCG/2ML IJ SOLN
INTRAMUSCULAR | Status: AC
  Filled 2016-04-20: qty 2

## 2016-04-20 MED ORDER — PHENYLEPHRINE-KETOROLAC 1-0.3 % IO SOLN
INTRAOCULAR | Status: AC
  Filled 2016-04-20: qty 4

## 2016-04-20 SURGICAL SUPPLY — 8 items
CLOTH BEACON ORANGE TIMEOUT ST (SAFETY) ×1 IMPLANT
GLOVE BIOGEL PI IND STRL 7.0 (GLOVE) IMPLANT
GLOVE BIOGEL PI INDICATOR 7.0 (GLOVE) ×1
GLOVE EXAM NITRILE MD LF STRL (GLOVE) ×1 IMPLANT
INST SET CATARACT ~~LOC~~ (KITS) ×2 IMPLANT
PAD ARMBOARD 7.5X6 YLW CONV (MISCELLANEOUS) ×1 IMPLANT
SIGHTPATH CAT PROC W REG LENS (Ophthalmic Related) ×1 IMPLANT
WATER STERILE IRR 250ML POUR (IV SOLUTION) ×1 IMPLANT

## 2016-04-20 NOTE — Transfer of Care (Signed)
Immediate Anesthesia Transfer of Care Note  Patient: Jeff Silva  Procedure(s) Performed: Procedure(s) with comments: CATARACT EXTRACTION PHACO AND INTRAOCULAR LENS PLACEMENT (IOC) (Right) - CDE: 7.40  Patient Location: Short Stay  Anesthesia Type:MAC  Level of Consciousness: awake, alert , oriented and patient cooperative  Airway & Oxygen Therapy: Patient Spontanous Breathing  Post-op Assessment: Post -op Vital signs reviewed and stable  Post vital signs: Reviewed and stable  Last Vitals:  Vitals:   04/20/16 0943  BP: (!) 152/100  Pulse: 60  Resp: 18  Temp: 36.5 C    Last Pain:  Vitals:   04/20/16 0943  TempSrc: Oral         Complications: No apparent anesthesia complications

## 2016-04-20 NOTE — Op Note (Signed)
04/20/2016  11:54 AM  PATIENT:  Marykay LexJames R Cadden  55 y.o. male  PRE-OPERATIVE DIAGNOSIS:  nuclear cataract right eye  POST-OPERATIVE DIAGNOSIS:  nuclear cataract right eye  PROCEDURE:  Procedure(s): CATARACT EXTRACTION PHACO AND INTRAOCULAR LENS PLACEMENT (IOC)  SURGEON:  Surgeon(s): Susa Simmondsarroll F Ionia Schey, MD  ASSISTANTS: Trenton FoundsWendy Kendrick, CST   ANESTHESIA STAFF: Anesthesiologist: Mal AmabileMichael Foster, MD CRNA: Earleen NewportAmy A Adams, CRNA  ANESTHESIA:   topical and MAC  REQUESTED LENS POWER: 22.5  LENS IMPLANT INFORMATION:   Alcon SN60WF  S/n [id number]  Exp 07/2019  CUMULATIVE DISSIPATED ENERGY:7.40  INDICATIONS:see scanned office H&P for details  OP FINDINGS:dense cortical and PSC  COMPLICATIONS:None  PROCEDURE:  The patient was brought to the operating room in good condition.  The operative eye was prepped and draped in the usual fashion for intraocular surgery.  Lidocaine gel was dropped onto the eye.  A 2.4 mm 10 O'clock near clear corneal stepped incision and a 12 O'clock stab incision were created.  Viscoat was instilled into the anterior chamber.  The 5 mm anterior capsulorhexis was performed with a bent needle cystotome and Utrata forceps.  The lens was hydrodissected and hydrodelineated with a cannula and balanced salt solution and rotated with a Kuglen hook.  Phacoemulsification was perfomed in the divide and conquer technique.  The remaining cortex was removed with I&A and the capsular surfaces polished as necessary.  Provisc was placed into the capsular bag and the lens inserted with the Alcon inserter.  The viscoelastic was removed with I&A and the lens "rocked" into position.  The wounds were hydrated and te anterior chamber was refilled with balanced salt solution.  The wounds were checked for leakage and rehydrated as necessary.  The lid speculum and drapes were removed and the patient was transported to short stay in good condition.  PATIENT DISPOSITION:  Short Stay

## 2016-04-20 NOTE — Addendum Note (Signed)
Addendum  created 04/20/16 1145 by Mal AmabileMichael Tomesha Sargent, MD   Sign clinical note

## 2016-04-20 NOTE — H&P (Signed)
I have reviewed the pre printed H&P, the patient was re-examined, and I have identified no significant interval changes in the patient's medical condition.  There is no change in the plan of care since the history and physical of record. 

## 2016-04-20 NOTE — Anesthesia Postprocedure Evaluation (Signed)
Anesthesia Post Note  Patient: Jeff LexJames R Richeson  Procedure(s) Performed: Procedure(s) (LRB): CATARACT EXTRACTION PHACO AND INTRAOCULAR LENS PLACEMENT (IOC) (Right)  Patient location during evaluation: PACU Anesthesia Type: MAC Level of consciousness: awake and alert and oriented Pain management: pain level controlled Vital Signs Assessment: post-procedure vital signs reviewed and stable Respiratory status: spontaneous breathing, nonlabored ventilation and respiratory function stable Cardiovascular status: stable and blood pressure returned to baseline Postop Assessment: no signs of nausea or vomiting Anesthetic complications: no    Last Vitals:  Vitals:   04/20/16 0943  BP: (!) 152/100  Pulse: 60  Resp: 18  Temp: 36.5 C    Last Pain:  Vitals:   04/20/16 0943  TempSrc: Oral                 Amazing Cowman A.

## 2016-04-20 NOTE — Brief Op Note (Signed)
04/20/2016  11:54 AM  PATIENT:  Jeff Silva  [age] y.o. male  PRE-OPERATIVE DIAGNOSIS:  nuclear cataract right eye  POST-OPERATIVE DIAGNOSIS:  nuclear cataract right eye  PROCEDURE:  Procedure(s): CATARACT EXTRACTION PHACO AND INTRAOCULAR LENS PLACEMENT (IOC)  SURGEON:  Surgeon(s): Susa Simmondsarroll F Virtie Bungert, MD  ASSISTANTS: Trenton FoundsWendy Kendrick, CST   ANESTHESIA STAFF: Anesthesiologist: Mal AmabileMichael Foster, MD CRNA: Earleen NewportAmy A Adams, CRNA  ANESTHESIA:   topical and MAC  REQUESTED LENS POWER: 22.5  LENS IMPLANT INFORMATION:   Alcon SN60WF  S/n [id number]  Exp 07/2019  CUMULATIVE DISSIPATED ENERGY:7.40  INDICATIONS:see scanned office H&P for details  OP FINDINGS:dense cortical and PSC  COMPLICATIONS:None  DICTATION #: none  PLAN OF CARE: as above  PATIENT DISPOSITION:  Short Stay

## 2016-04-20 NOTE — Discharge Instructions (Signed)

## 2016-04-20 NOTE — Anesthesia Postprocedure Evaluation (Signed)
Anesthesia Post Note  Patient: Jeff Silva  Procedure(s) Performed: Procedure(s) (LRB): CATARACT EXTRACTION PHACO AND INTRAOCULAR LENS PLACEMENT (IOC) (Right)  Patient location during evaluation: Short Stay Anesthesia Type: MAC Level of consciousness: awake and alert and oriented Pain management: pain level controlled Vital Signs Assessment: post-procedure vital signs reviewed and stable Cardiovascular status: stable Postop Assessment: no signs of nausea or vomiting Anesthetic complications: no    Last Vitals:  Vitals:   04/20/16 0943  BP: (!) 152/100  Pulse: 60  Resp: 18  Temp: 36.5 C    Last Pain:  Vitals:   04/20/16 0943  TempSrc: Oral                 ADAMS, AMY A

## 2016-04-20 NOTE — Anesthesia Preprocedure Evaluation (Addendum)
Anesthesia Evaluation  Patient identified by MRN, date of birth, ID band Patient awake    Reviewed: Allergy & Precautions, NPO status , Patient's Chart, lab work & pertinent test results, reviewed documented beta blocker date and time   Airway Mallampati: I  TM Distance: >3 FB Neck ROM: Full    Dental no notable dental hx. (+) Teeth Intact   Pulmonary neg pulmonary ROS, former smoker,    Pulmonary exam normal breath sounds clear to auscultation       Cardiovascular hypertension, Pt. on medications and Pt. on home beta blockers Normal cardiovascular exam Rhythm:Regular Rate:Normal     Neuro/Psych Anxiety Cataract OD    GI/Hepatic negative GI ROS, Neg liver ROS,   Endo/Other  diabetes, Poorly Controlled, Type 2, Oral Hypoglycemic Agents  Renal/GU Renal InsufficiencyRenal disease  negative genitourinary   Musculoskeletal negative musculoskeletal ROS (+)   Abdominal Normal abdominal exam  (+)   Peds  Hematology  (+) anemia ,   Anesthesia Other Findings   Reproductive/Obstetrics                           Anesthesia Physical Anesthesia Plan  ASA: III  Anesthesia Plan: MAC   Post-op Pain Management:    Induction:   Airway Management Planned: Natural Airway and Nasal Cannula  Additional Equipment:   Intra-op Plan:   Post-operative Plan:   Informed Consent: I have reviewed the patients History and Physical, chart, labs and discussed the procedure including the risks, benefits and alternatives for the proposed anesthesia with the patient or authorized representative who has indicated his/her understanding and acceptance.   Dental advisory given  Plan Discussed with:   Anesthesia Plan Comments:         Anesthesia Quick Evaluation

## 2016-04-24 ENCOUNTER — Encounter (HOSPITAL_COMMUNITY): Payer: Self-pay | Admitting: Ophthalmology

## 2016-09-03 ENCOUNTER — Encounter: Payer: Self-pay | Admitting: Neurology

## 2016-09-03 ENCOUNTER — Ambulatory Visit (INDEPENDENT_AMBULATORY_CARE_PROVIDER_SITE_OTHER): Payer: Medicare Other | Admitting: Neurology

## 2016-09-03 ENCOUNTER — Telehealth: Payer: Self-pay | Admitting: Neurology

## 2016-09-03 VITALS — BP 131/88 | HR 76 | Ht 68.5 in | Wt 151.1 lb

## 2016-09-03 DIAGNOSIS — M62542 Muscle wasting and atrophy, not elsewhere classified, left hand: Secondary | ICD-10-CM | POA: Diagnosis not present

## 2016-09-03 DIAGNOSIS — R29898 Other symptoms and signs involving the musculoskeletal system: Secondary | ICD-10-CM

## 2016-09-03 DIAGNOSIS — M62522 Muscle wasting and atrophy, not elsewhere classified, left upper arm: Secondary | ICD-10-CM

## 2016-09-03 DIAGNOSIS — R200 Anesthesia of skin: Secondary | ICD-10-CM | POA: Diagnosis not present

## 2016-09-03 DIAGNOSIS — M5412 Radiculopathy, cervical region: Secondary | ICD-10-CM

## 2016-09-03 DIAGNOSIS — M5010 Cervical disc disorder with radiculopathy, unspecified cervical region: Secondary | ICD-10-CM

## 2016-09-03 NOTE — Telephone Encounter (Signed)
PER DR AHERN PT NEEDS MRI 1/10 PT NEEDS AFTERNOON APPT FOR MRI IN MOBILE UNIT.

## 2016-09-03 NOTE — Patient Instructions (Addendum)
Remember to drink plenty of fluid, eat healthy meals and do not skip any meals. Try to eat protein with a every meal and eat a healthy snack such as fruit or nuts in between meals. Try to keep a regular sleep-wake schedule and try to exercise daily, particularly in the form of walking, 20-30 minutes a day, if you can.   As far as diagnostic testing: MRI cervical spine  Our phone number is [phone]. We also have an after hours call service for urgent matters and there is a physician on-call for urgent questions. For any emergencies you know to call 911 or go to the nearest emergency room   Cervical Radiculopathy Introduction Cervical radiculopathy happens when a nerve in the neck (cervical nerve) is pinched or bruised. This condition can develop because of an injury or as part of the normal aging process. Pressure on the cervical nerves can cause pain or numbness that runs from the neck all the way down into the arm and fingers. Usually, this condition gets better with rest. Treatment may be needed if the condition does not improve. What are the causes? This condition may be caused by:  Injury.  Slipped (herniated) disk.  Muscle tightness in the neck because of overuse.  Arthritis.  Breakdown or degeneration in the bones and joints of the spine (spondylosis) due to aging.  Bone spurs that may develop near the cervical nerves. What are the signs or symptoms? Symptoms of this condition include:  Pain that runs from the neck to the arm and hand. The pain can be severe or irritating. It may be worse when the neck is moved.  Numbness or weakness in the affected arm and hand. How is this diagnosed? This condition may be diagnosed based on symptoms, medical history, and a physical exam. You may also have tests, including:  X-rays.  CT scan.  MRI.  Electromyogram (EMG).  Nerve conduction tests. How is this treated? In many cases, treatment is not needed for this condition. With  rest, the condition usually gets better over time. If treatment is needed, options may include:  Wearing a soft neck collar for short periods of time.  Physical therapy to strengthen your neck muscles.  Medicines, such as NSAIDs, oral corticosteroids, or spinal injections.  Surgery. This may be needed if other treatments do not help. Various types of surgery may be done depending on the cause of your problems. Follow these instructions at home: Managing pain  Take over-the-counter and prescription medicines only as told by your health care provider.  If directed, apply ice to the affected area.  Put ice in a plastic bag.  Place a towel between your skin and the bag.  Leave the ice on for 20 minutes, 2-3 times per day.  If ice does not help, you can try using heat. Take a warm shower or warm bath, or use a heat pack as told by your health care provider.  Try a gentle neck and shoulder massage to help relieve symptoms. Activity  Rest as needed. Follow instructions from your health care provider about any restrictions on activities.  Do stretching and strengthening exercises as told by your health care provider or physical therapist. General instructions  If you were given a soft collar, wear it as told by your health care provider.  Use a flat pillow when you sleep.  Keep all follow-up visits as told by your health care provider. This is important. Contact a health care provider if:  Your condition  does not improve with treatment. Get help right away if:  Your pain gets much worse and cannot be controlled with medicines.  You have weakness or numbness in your hand, arm, face, or leg.  You have a high fever.  You have a stiff, rigid neck.  You lose control of your bowels or your bladder (have incontinence).  You have trouble with walking, balance, or speaking. This information is not intended to replace advice given to you by your health care provider. Make sure you  discuss any questions you have with your health care provider. Document Released: 05/12/2001 Document Revised: 01/23/2016 Document Reviewed: 10/11/2014  2017 [employer]

## 2016-09-03 NOTE — Telephone Encounter (Signed)
Jeff Silva: This is a self-pay patient who needs an MRI of the cervical spine. He is self pay. Can you call him in the morning and get him scheduled here for MRi cervical spine and arrange for a payment plan please. Thank you

## 2016-09-03 NOTE — Progress Notes (Addendum)
[name]    Provider:  Dr Lucia GaskinsAhern Referring Provider: Pearson GrippeJames Kim, MD Primary Care Physician:  Pearson GrippeJames Kim, MD  CC:  Suspected stroke, leg arm shoulder-hand weakness, headaches, balance issues, muscle loss  HPI:  Jeff Silva is a 56 y.o. male here as a referral from Dr. Sherwood GamblerFusco for headaches, muscle wasting. Past medical history hypertension, chronic kidney disease, type 2 diabetes, anxiety, muscle wasting, headache, history of falls, severe diabetic retinopathy, left shoulder and neck pain, balance problems. Patient found out he was diabetic when his eyesight was declining, he was diagnosed with severe diabetic retinopathy. He was having dizziness due to elevated blood pressure > 200 about a year ago had several falls and since then has fallen and hit his forehead and bent his neck back and heard vertebra pop about 8 months ago and then landed on his left shoulder. Even before that fall he started noticing wasting if both arms distally, he has progressive weakness and muscle weakness, can't pick anything up and steadily worse in the last 6 months. Left hand is worse than the right. He has neuropathy from the diabetes in the feet and hands.he is left handed and he can;t even play the guitar anymore, weakened grip. He has baseline neuropathy but he has additional numbness in the left hand in the pinky and ring finger. More weakness in those digits as well. He still has dizziness and weakness and headaches and he is wobbly with imbalance. He has chronic neck pain from the shoulder to the arm. He has difficulty swallowing, he is coughing a lot, he has some muscle spasms on the shoulders and his left hand spasms. Wife can see his muscles jumping in the legs, occ in the arms as well. He had lost weight but he has gained it back and his weight has been stable at about 150 (he has lost a lot of weight due to diabetes and he was 120). Mother had a stroke but no FHx of neuromuscular disease.    Reviewed notes, labs and imaging from outside physicians, which showed:   Review primary care records. Patient complaining of neck pain due to a fall that occurred several months ago. Patient's of the cup better for a few days but recently within the last few weeks has been hurting more. He expressed balance issues and ambulates with a cane. Patient has been dropping things with his left arm. Patient also feels he has had a stroke in the past which cause muscle loss. He presents to clinic 07/28/2016 after presenting with a fall a few weeks back. Currently having left shoulder pain, left lower extremity weakness. Also bilateral hand muscles are weakening. He noted muscle wasting and atrophy.  Cholesterol 398, triglycerides 1187, hemoglobin A1c 7.5, creatinine 2.4, BUN 46 labs drawn 04/14/2016.TSH 2.440 10/24/2014.      Review of Systems: Patient complains of symptoms per HPI as well as the following symptoms: Fatigue, blurred vision, eye pain, palpitations, feeling cold, ringing in ears, spinning sensation, diarrhea, constipation, joint pain, joint swelling, cramps, aching muscles, birthmarks, impotence, runny nose, skin sensitivity, memory loss, insomnia, headache, numbness, weakness, difficulty swallowing, dizziness, passing out, tremor, depression, anxiety, not enough sleep, decreased energy, disinterest in activities. Pertinent negatives per HPI. All others negative.   Social History   Social History  . Marital status: Married    Spouse name: [name]  . Number of children: 0  . Years of education: 12   Occupational History  . Unable to work  Social History Main Topics  . Smoking status: Former Smoker    Packs/day: 0.25    Years: 25.00    Types: Cigarettes    Quit date: 05/31/2014  . Smokeless tobacco: Never Used  . Alcohol use Yes     Comment: occassional  . Drug use:     Types: Marijuana     Comment: last few days  . Sexual activity: Yes    Birth control/ protection: None    Other Topics Concern  . Not on file   Social History Narrative   Lives w/ wife   Caffeine use: none    Family History  Problem Relation Age of Onset  . Neuromuscular disorder Neg Hx     Past Medical History:  Diagnosis Date  . Anxiety   . Chronic kidney disease    stage 3 kidney disease  . Diabetes mellitus without complication (HCC)   . History of kidney stones   . Hypertension   . Neuropathy (HCC)   . Retinopathy     Past Surgical History:  Procedure Laterality Date  . CATARACT EXTRACTION W/PHACO Left 12/03/2014   Procedure: CATARACT EXTRACTION PHACO AND INTRAOCULAR LENS PLACEMENT (IOC);  Surgeon: Susa Simmonds, MD;  Location: [facility];  Service: Ophthalmology;  Laterality: Left;  CDE:23.07  . CATARACT EXTRACTION W/PHACO Right 04/20/2016   Procedure: CATARACT EXTRACTION PHACO AND INTRAOCULAR LENS PLACEMENT (IOC);  Surgeon: Susa Simmonds, MD;  Location: [facility];  Service: Ophthalmology;  Laterality: Right;  CDE: 7.40  . LITHOTRIPSY    . SHOULDER ARTHROSCOPY Right 1999  . TONSILLECTOMY     Around 7th grade    Current Outpatient Prescriptions  Medication Sig Dispense Refill  . ALPRAZolam (XANAX) 1 MG tablet Take 1 mg by mouth 3 (three) times daily as needed for anxiety.    Marland Kitchen amitriptyline (ELAVIL) 25 MG tablet Take 25 mg by mouth as needed for sleep.    . cloNIDine (CATAPRES) 0.1 MG tablet Take 0.1 mg by mouth as needed.    . cloNIDine (CATAPRES) 0.2 MG tablet Take 0.2 mg by mouth as needed.    . Empagliflozin-Linagliptin (GLYXAMBI) 10-5 MG TABS Take 1 tablet by mouth daily.    Marland Kitchen glyBURIDE (DIABETA) 5 MG tablet Take 5 mg by mouth daily with breakfast.    . hydrochlorothiazide (HYDRODIURIL) 50 MG tablet Take 50 mg by mouth daily.    Marland Kitchen lisinopril (PRINIVIL,ZESTRIL) 5 MG tablet Take 5 mg by mouth daily.    . metoprolol (LOPRESSOR) 50 MG tablet Take 50 mg by mouth 2 (two) times daily.    . Vitamin D, Ergocalciferol, (DRISDOL) 50000 units CAPS capsule Take 50,000 Units  by mouth every 7 (seven) days.     No current facility-administered medications for this visit.     Allergies as of 09/03/2016 - Review Complete 09/03/2016  Allergen Reaction Noted  . Codeine Nausea And Vomiting and Other (See Comments) 10/26/2014    Vitals: BP 131/88 (BP Location: Left Arm, Patient Position: Sitting, Cuff Size: Normal)   Pulse 76   Ht 5' 8.5" (1.74 m)   Wt 151 lb 1.6 oz (68.5 kg)   BMI 22.64 kg/m  Last Weight:  Wt Readings from Last 1 Encounters:  09/03/16 151 lb 1.6 oz (68.5 kg)   Last Height:   Ht Readings from Last 1 Encounters:  09/03/16 5' 8.5" (1.74 m)    Physical exam: Exam: Gen: NAD, conversant  CV: RRR, no MRG. No Carotid Bruits. No peripheral edema, warm, nontender Eyes: Conjunctivae clear without exudates or hemorrhage  Neuro: Detailed Neurologic Exam  Speech:    Speech is normal; fluent and spontaneous with normal comprehension.  Cognition:    The patient is oriented to person, place, and time;     recent and remote memory intact;     language fluent;     normal attention, concentration,     fund of knowledge Cranial Nerves:    The pupils are equal, round, and reactive to light. Attempted funduscopic exam could not visualize. Visual fields are full to finger confrontation. Slight dysconjugate gaze but Extraocular movements are intact. Trigeminal sensation is intact and the muscles of mastication are normal. The face is symmetric. The palate elevates in the midline. Hearing intact. Voice is normal. Shoulder shrug is normal. The tongue has normal motion without fasciculations.   Coordination:    Normal finger to nose and heel to shin..   Gait:    Heel-toe and tandem gait are intact with slight imbalance  Motor Observation:    Distal UE wasting most pronounced in the left FDI. No involuntary movements noted. No fasciculations noted. Tone:    Normal muscle tone.    Posture:    Posture is normal. normal erect     Strength: Left triceps 4/5, Left interossei, opponens and grip weakness >> right, mild bilateral LE prox weakness otherwise strength is V/V in the upper and lower limbs.      Sensation: Decreased distally to all modalities in the extremities. Sway on Romberg.     Reflex Exam:  DTR's:    Deep tendon reflexes in the lower extremities are hyporeflexic and normal in the upper extremities.   Toes:    The toes are downgoing bilaterally.   Clonus:    Clonus is absent    Assessment/Plan:  56 year old male with left upper extremity weakness and distal wasting most pronounced in the left FDI. No upper motor neuron signs. Given history of falls and neck trauma with neck pain this suggests cervical radiculopathy possibly C7/C8. Patient is self pay so will start with an MRI cervical spine - may need follow up pending results with an MRI of the brain or emg/ncs if mri cervical spine is not conclusive. His balance issues are likely due to peripheral neuropathy but cannot rule out cervical myelopathy. Patient is supposed to be using a cane but he has not. Discussed all risks, advised him always to use his walking aids, he declines physical therapy at this time for gait and safety evaluation due to uninsured status. Advised him to be vigilant at this time for falls especially given the uncertain nature of cervical spine status.   cc: [name], MD  Naomie Dean, MD  [facility] [address] [place], [place] [address]  Phone [phone] Fax [phone]

## 2016-09-04 NOTE — Telephone Encounter (Signed)
I also put note on Emily E desk also yesterday, thank you.

## 2016-09-04 NOTE — Telephone Encounter (Signed)
Thank you, noted.

## 2016-09-04 NOTE — Telephone Encounter (Signed)
I just got off the phone with Fayrene FearingJames and I scheduled him for 09/09/16 for our mobile unit.

## 2016-09-04 NOTE — Telephone Encounter (Signed)
He is scheduled for the [facility] mobile unit for 09/09/16

## 2016-09-09 ENCOUNTER — Ambulatory Visit (INDEPENDENT_AMBULATORY_CARE_PROVIDER_SITE_OTHER): Payer: Self-pay

## 2016-09-09 DIAGNOSIS — M5412 Radiculopathy, cervical region: Secondary | ICD-10-CM | POA: Diagnosis not present

## 2016-09-09 DIAGNOSIS — R29898 Other symptoms and signs involving the musculoskeletal system: Secondary | ICD-10-CM | POA: Diagnosis not present

## 2016-09-09 DIAGNOSIS — M62542 Muscle wasting and atrophy, not elsewhere classified, left hand: Secondary | ICD-10-CM | POA: Diagnosis not present

## 2016-09-14 ENCOUNTER — Telehealth: Payer: Self-pay | Admitting: *Deleted

## 2016-09-14 NOTE — Telephone Encounter (Signed)
LVM for pt to call about results. Gave [facility] phone number.  

## 2016-09-14 NOTE — Telephone Encounter (Signed)
Pt returned RN's call °

## 2016-09-14 NOTE — Telephone Encounter (Signed)
Called and spoke to pt about MRI cervical results per AA,MD note. Neck flexion, hurts after he landed on left shoulder very hard. He is still having a lot of shoulder pain. Wondering if this is associated with left hand problems/inability to use left hand?   He states the MRI cervical spine he just had done he is going to be making payments for 7 months. He has applied for social security disability. Has hearing in April. He will call back if he gets approved and schedule MRI brain at that point. Wants to hold hold on MRI brain at this point since he is self pay.  Feels there are vertebrae that he can feel when he is having headaches. He feels he can do "deep tissue massage" and relieve headache.   He is concerned about his left shoulder still. Having a lot of pain. He has limited range of motion in left shoulder. He is wondering what Dr  Lucia GaskinsAhern recommends. If she has any ideas for home therapies? Advised Dr Lucia GaskinsAhern seeing patient's throughout the day and I will call back tomorrow at the latest to advise. He verbalized understanding.

## 2016-09-14 NOTE — Telephone Encounter (Signed)
He could have had some nerve damage from the fall. He really needs more extensive workup. He should have an MRI brain and something called an emg/ncs where I measure all the nerve in his arms and I also examine his muscles. Unfortunately I know this can all be very expensive and I apologize. As soon as he has insurance I need him to let me know and we need to go forward with further extensive testing. I'm sorry about the cost. Alternatively has he tried cone assistance that can help with cost of care her within [facility]? I can't remember if he tried to get that.   thanks

## 2016-09-14 NOTE — Telephone Encounter (Signed)
-----   Message from Anson FretAntonia B Ahern, MD sent at 09/12/2016 11:21 AM EST ----- MRI cervical spine is essentially normal, very mild arthritic changes. The next thing to do is MRI of the brain but I know finances are an issue. The possibility of having to get an MRI brain was discussed at appointment. Please ask patient if he is willing to proceed to this next thanks

## 2016-09-15 NOTE — Telephone Encounter (Signed)
Called and spoke to pt. Relayed Dr Trevor MaceAhern's message below. Offered to send pt cone financial assistance information to see if he qualifies. He is agreeable to this. I mailed him contact information, application, and policy information. He will keep us updated if he is approved for this. Advised we could then get him scheduled for the tests Dr Lucia GaskinsAhern recommends. He verbalized understanding and appreciation.  He also stated that he "neglected" to tell me yesterday that he fell and hit his head and has a small gash on forehead. He denies headache, trouble concentrating, thinking, any confusion. No other symptoms. He states he is not concerned and is feeling okay. Advised he should go to ER if he starts having any of these concerning symptoms. He verbalized understanding and stated he wanted to make sure we documented that he told us.

## 2016-09-21 ENCOUNTER — Other Ambulatory Visit: Payer: Self-pay | Admitting: Neurology

## 2016-09-21 ENCOUNTER — Telehealth: Payer: Self-pay | Admitting: *Deleted

## 2016-09-21 NOTE — Addendum Note (Signed)
Addended by: Naomie DeanAHERN, Jesly Hartmann B on: 09/21/2016 05:33 PM   Modules accepted: Orders

## 2016-09-21 NOTE — Telephone Encounter (Signed)
Called patient back. He said it has happened once before. But since, he has had it happened a handful of times. Ting/numb sensation on corner of left mouth and cheek on left side. Feels like a "warming" sensation. He states his wife's cousin, had a hemorrhagic stroke.  He notices later on in the evening, and when he is in the shower he feels the sensation more often. Sensation only lasts about a minute or less.   Wondering about [facility] at [facility] and setting up a payment plan. He has spoken with Angie previously. He has not received cone assistance information yet in the mail. I verified address with him. Advised him to let me know if he doesn't receive this and I can re-send this. He states his BP is okay.Denies trouble walking, speaking, understanding, paralysis or numbness arms or legs that is new, no nausea.   Having more tension headaches due to neck pain per patient. They are not bad headaches.   Still has left handed weakness, but has not gotten any worse.  He declines going to ED at this time. He states he will go if he has any worsening symptoms. Advised I will see if Dr Lucia GaskinsAhern ok for him to wait to have MRI set up here at our office.

## 2016-09-21 NOTE — Telephone Encounter (Signed)
Requesting a call due to new symptoms : numbness/ tingling cheek and lip lft side . Call pat at [phone]

## 2016-09-21 NOTE — Telephone Encounter (Signed)
Per AA,MD, he needs to proceed to ED to be evaluated.

## 2016-09-21 NOTE — Telephone Encounter (Signed)
Ordered MRI brain. Irving Burtonmily can you set up a payment plan with him please? thanks

## 2016-09-22 ENCOUNTER — Telehealth: Payer: Self-pay | Admitting: Neurology

## 2016-09-22 NOTE — Telephone Encounter (Signed)
I called the patient back but he couldn't make the appointment with me due to his wife had their calender and he was waiting for her to get home so he see when will be a good time..Marland Kitchen

## 2016-09-22 NOTE — Telephone Encounter (Signed)
Patient returning call. Please call.

## 2016-09-22 NOTE — Telephone Encounter (Signed)
I spoke with the patient and he informed me that he did receive the packet to apply for the financial assisting program [facility]. He told me that he would like to wait on having the MRI until he receives assistant  because he said he didn't want to get in more debt than he already is with the exam.. I informed him that is no problem and to let me know whenever he hears something so we can schedule his MRI. He also informed me that he was very appreciate with all that [facility] has done for him.

## 2016-09-22 NOTE — Telephone Encounter (Signed)
Noted, thank you

## 2016-09-22 NOTE — Telephone Encounter (Signed)
Pt returned Emily's call °

## 2016-09-22 NOTE — Telephone Encounter (Signed)
I left a voicemail for the patient to call me back about scheduling his MRI.

## 2016-09-22 NOTE — Telephone Encounter (Signed)
I called the patient back he informed me that he wants to wait on scheduling the MRI until he gets the financial assisting [facility].

## 2016-09-22 NOTE — Telephone Encounter (Signed)
If its not free then I am sure it is not much.. As soon as he gets the letter he is going to contact me and I believe it will be best if he has the MRI at [place] if he has the assistance.

## 2016-09-22 NOTE — Telephone Encounter (Signed)
Jeff Silva, will [facility] provide free MRIs if they are in the [facility] assistance financial program? I think so but but not entirely sure.Marland Kitchen..Marland Kitchen

## 2016-10-20 ENCOUNTER — Ambulatory Visit (HOSPITAL_COMMUNITY)
Admission: RE | Admit: 2016-10-20 | Discharge: 2016-10-20 | Disposition: A | Discharge status: Home / Self Care | Source: Ambulatory Visit | Attending: Neurology | Admitting: Neurology

## 2016-10-20 DIAGNOSIS — M62542 Muscle wasting and atrophy, not elsewhere classified, left hand: Secondary | ICD-10-CM

## 2016-10-20 DIAGNOSIS — R200 Anesthesia of skin: Secondary | ICD-10-CM | POA: Insufficient documentation

## 2016-10-20 DIAGNOSIS — M62522 Muscle wasting and atrophy, not elsewhere classified, left upper arm: Secondary | ICD-10-CM

## 2016-10-20 DIAGNOSIS — I638 Other cerebral infarction: Secondary | ICD-10-CM | POA: Insufficient documentation

## 2016-10-20 DIAGNOSIS — I6782 Cerebral ischemia: Secondary | ICD-10-CM | POA: Insufficient documentation

## 2016-10-20 DIAGNOSIS — R29898 Other symptoms and signs involving the musculoskeletal system: Secondary | ICD-10-CM | POA: Insufficient documentation

## 2016-10-20 LAB — POCT I-STAT CREATININE: Creatinine, Ser: 1.8 mg/dL — ABNORMAL HIGH (ref 0.61–1.24)

## 2016-10-20 MED ORDER — GADOBENATE DIMEGLUMINE 529 MG/ML IV SOLN
7.0000 mL | Freq: Once | INTRAVENOUS | Status: AC | PRN
  Administered 2016-10-20: 7 mL via INTRAVENOUS

## 2016-10-21 ENCOUNTER — Telehealth: Payer: Self-pay | Admitting: Neurology

## 2016-10-21 NOTE — Telephone Encounter (Signed)
Pt just had MRI brain yesterday and results not yet released by MD to share w/ pt.   IMPRESSION: 1. 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 2. Mild chronic small vessel ischemic disease.

## 2016-10-21 NOTE — Telephone Encounter (Signed)
Pt request MRI results. Can LVM.

## 2016-10-22 ENCOUNTER — Telehealth: Payer: Self-pay

## 2016-10-22 NOTE — Telephone Encounter (Signed)
-----   Message from Anson FretAntonia B Ahern, MD sent at 10/22/2016 11:07 AM EST ----- Patient has had right-sided small strokes. If he likes I can show them the images. But he needs to take aspirin daily and he needs a whole stroke evaluation (CT of the blood vessels of the head and neck, echocardiogram of the heart, more labs). He was uninsured so please discuss how he would like to proceed. I;m happy to se ehim in follo wup very soon to review the images and discuss the workup and/or just order the workup and follow up with him afterwards. thanks

## 2016-10-22 NOTE — Telephone Encounter (Signed)
Pt returned Rn's call °

## 2016-10-22 NOTE — Telephone Encounter (Signed)
Pt called back to say that he talked to kidney MD and he ok'd baby aspirin (81 mg daily). Reports that he just took one. Asked and apologized for additional questions but says that information was "a lot to take in." Answered and re-assured pt as able. Also scheduled appt w/ Dr. Lucia GaskinsAhern on Monday to further discuss MRI results and plan.

## 2016-10-22 NOTE — Telephone Encounter (Signed)
Please see results note and let me know.

## 2016-10-22 NOTE — Telephone Encounter (Signed)
Called pt and reviewed MRI results per MD. Recommended daily aspirin for further stroke prevention. Answered general questions about results, stroke, s/s, suggested work-up, possible treatment, etc. Pt reports that he had been approved for [facility] assistance [facility] which covered this [facility]. Would like to undergo any additional testing that would fall under pt assistance as well. Says that he would check w/ his kidney doctor on aspirin protocol as it was d/c'd d/t kidney function in the past. Agreed to call back to schedule follow-up appt after checking his schedule that is currently booked w/ other appts.

## 2016-10-26 ENCOUNTER — Ambulatory Visit (INDEPENDENT_AMBULATORY_CARE_PROVIDER_SITE_OTHER): Payer: Self-pay | Admitting: Neurology

## 2016-10-26 ENCOUNTER — Encounter: Payer: Self-pay | Admitting: Neurology

## 2016-10-26 VITALS — BP 182/90 | HR 80 | Ht 68.5 in | Wt 150.5 lb

## 2016-10-26 DIAGNOSIS — I639 Cerebral infarction, unspecified: Secondary | ICD-10-CM | POA: Diagnosis not present

## 2016-10-26 NOTE — Patient Instructions (Signed)
Remember to drink plenty of fluid, eat healthy meals and do not skip any meals. Try to eat protein with a every meal and eat a healthy snack such as fruit or nuts in between meals. Try to keep a regular sleep-wake schedule and try to exercise daily, particularly in the form of walking, 20-30 minutes a day, if you can.   As far as your medications are concerned, I would like to suggest: Recommend Lipitor for cholesterol, ASA 325mg  a day  As far as diagnostic testing: MRA of head, echocardiogram and carotid dopplers - cardiology referral  I would like to see you back in 3 months, sooner if we need to. Please call us with any interim questions, concerns, problems, updates or refill requests.   Our phone number is [phone]. We also have an after hours call service for urgent matters and there is a physician on-call for urgent questions. For any emergencies you know to call 911 or go to the nearest emergency room   Ischemic Stroke An ischemic stroke (cerebrovascular accident, or CVA) is the sudden death of brain tissue that occurs when an area of the brain does not get enough oxygen. It is a medical emergency that must be treated right away. An ischemic stroke can cause permanent loss of brain function. This can cause problems with how different parts of your body function. What are the causes? This condition is caused by a decrease of oxygen supply to an area of the brain, which may be the result of:  A small blood clot (embolus) or a buildup of plaque in the blood vessels (atherosclerosis) that blocks blood flow in the brain.  An abnormal heart rhythm (atrial fibrillation).  A blocked or damaged artery in the head or neck. What increases the risk? Certain factors may make you more likely to develop this condition. Some of these factors are things that you can change, such as:  Obesity.  Smoking cigarettes.  Taking oral birth control, especially if you also use tobacco.  Physical  inactivity.  Excessive alcohol use.  Use of illegal drugs, especially cocaine and methamphetamine. Other risk factors include:  High blood pressure (hypertension).  High cholesterol.  Diabetes mellitus.  Heart disease.  Being [place] American, Native [address], Hispanic, or [place] Native.  Being over age 71.  Family history of stroke.  Previous history of blood clots, stroke, or transient ischemic attack (TIA).  Sickle cell disease.  Being a woman with a history of preeclampsia.  Migraine headache.  Sleep apnea.  Irregular heartbeats, such as atrial fibrillation.  Chronic inflammatory diseases, such as rheumatoid arthritis or lupus.  Blood clotting disorders (hypercoagulable state). What are the signs or symptoms? Symptoms of this condition usually develop suddenly, or you may notice them after waking up from sleep. Symptoms may include sudden:  Weakness or numbness in your face, arm, or leg, especially on one side of your body.  Trouble walking or difficulty moving your arms or legs.  Loss of balance or coordination.  Confusion.  Slurred speech (dysarthria).  Trouble speaking, understanding speech, or both (aphasia).  Vision changes-such as double vision, blurred vision, or loss of vision-inone or both eyes.  Dizziness.  Nausea and vomiting.  Severe headache with no known cause. The headache is often described as the worst headache ever experienced. If possible, make note of the exact time that you last felt like your normal self and what time your symptoms started. Tell your health care provider. If symptoms come and go, this could  be a sign of a warning stroke, or TIA. Get help right away, even if you feel better. How is this diagnosed? This condition may be diagnosed based on:  Your symptoms, your medical history, and a physical exam.  CT scan of the brain.  MRI.  CT angiogram. This test uses a computer to take X-rays of your arteries. A dye  may be injected into your blood to show the inside of your blood vessels more clearly.  MRI angiogram. This is a type of MRI that is used to evaluate the blood vessels.  Cerebral angiogram. This test uses X-rays and a dye to show the blood vessels in the brain and neck. You may need to see a health care provider who specializes in stroke care. A stroke specialist can be seen in person or through communication using telephone or television technology (telemedicine). Other tests may also be done to find the cause of the stroke, such as:  Electrocardiogram (ECG).  Continuous heart monitoring.  Echocardiogram.  Carotid ultrasound.  A scan of the brain circulation.  Blood tests.  Sleep study to check for sleep apnea. How is this treated? Treatment for this condition will depend on the duration, severity, and cause of your symptoms and on the area of the brain affected. It is very important to get treatment at the first sign of stroke symptoms. Some treatments work better if they are done within 3-6 hours of the onset of stroke symptoms. These initial treatments may include:  Aspirin.  Medicines to control blood pressure.  Medicine given by injection to dissolve the blood clot (thrombolytic).  Treatments given directly to the affected artery to remove or dissolve the blood clot. Other treatment options may include:  Oxygen.  IV fluids.  Medicines to thin the blood (anticoagulants or antiplatelets).  Procedures to increase blood flow. Medicines and changes to your diet may be used to help treat and manage risk factors for stroke, such as diabetes, high cholesterol, and high blood pressure. After a stroke, you may work with physical, speech, mental health, or occupational therapists to help you recover. Follow these instructions at home: Medicines  Take over-the-counter and prescription medicines only as told by your health care provider.  If you were told to take a medicine to  thin your blood, such as aspirin or an anticoagulant, take it exactly as told by your health care provider.  Taking too much blood-thinning medicine can cause bleeding.  If you do not take enough blood-thinning medicine, you will not have the protection that you need against another stroke and other problems.  Understand the side effects of taking anticoagulant medicine. When taking this type of medicine, make sure you:  Hold pressure over any cuts for longer than usual.  Tell your dentist and other health care providers that you are taking anticoagulants before you have any procedures that may cause bleeding.  Avoid activities that may cause trauma or injury. Eating and drinking  Follow instructions from your health care provider about diet.  Eat healthy foods.  If your ability to swallow was affected by the stroke, you may need to take steps to avoid choking, such as:  Taking small bites when eating.  Eating foods that are soft or pureed. Safety  Follow instructions from your health care team about physical activity.  Use a walker or cane as told by your health care provider.  Take steps to create a safe home environment in order to reduce the risk of falls. This may  include:  Having your home looked at by specialists.  Installing grab bars in the bedroom and bathroom.  Using safety equipment, such as raised toilets and a seat in the shower. General instructions  Do not use any tobacco products, such as cigarettes, chewing tobacco, and e-cigarettes. If you need help quitting, ask your health care provider.  Limit alcohol intake to no more than 1 drink a day for nonpregnant women and 2 drinks a day for men. One drink equals 12 oz of beer, 5 oz of wine, or 1 oz of hard liquor.  If you need help to stop using drugs or alcohol, ask your health care provider about a referral to a program or specialist.  Maintain an active and healthy lifestyle. Get regular exercise as told  by your health care provider.  Keep all follow-up visits as told by your health care provider, including visits with all specialists on your health care team. This is important. How is this prevented? Your risk of another stroke can be decreased by managing high blood pressure, high cholesterol, diabetes, heart disease, sleep apnea, and obesity. It can also be decreased by quitting smoking, limiting alcohol, and staying physically active. Your health care provider will continue to work with you on measures to prevent short-term and long-term complications of stroke. Get help right away if: You have:  Sudden weakness or numbness in your face, arm, or leg, especially on one side of your body.  Sudden confusion.  Sudden trouble speaking, understanding, or both (aphasia).  Sudden trouble seeing with one or both eyes.  Sudden trouble walking or difficulty moving your arms or legs.  Sudden dizziness.  Sudden loss of balance or coordination.  Sudden, severe headache with no known cause.  A partial or total loss of consciousness.  A seizure. Any of these symptoms may represent a serious problem that is an emergency. Do not wait to see if the symptoms will go away. Get medical help right away. Call your local emergency services (911 in [place] Do not drive yourself to the hospital.  This information is not intended to replace advice given to you by your health care provider. Make sure you discuss any questions you have with your health care provider. Document Released: 08/17/2005 Document Revised: 01/28/2016 Document Reviewed: 11/13/2015 Elsevier Interactive Patient Education  2017 [employer].

## 2016-10-26 NOTE — Progress Notes (Addendum)
[name]    Provider:  Dr Lucia Gaskins Referring Provider: [name], MD Primary Care Physician:  [name], MD  CC:  Suspected stroke, leg arm shoulder-hand weakness, headaches, balance issues, muscle loss  Interval history 10/26/2016: Patient returns today to discuss MRi brain which did show infarcts;  Personally reviewed images with patient, there are scattered small foci of cortical and subcortical diffusion abnormality in the right cerebral hemisphere which largely appear watershed in distribution(they may be embolic), with the most notable cluster of infarcts in the right centrum semiovale. Reviewed images with wife and patient and discussed further workup including MRA of the head and carotids, labs including hgba1c and lipids, echo (if no etiology found may consider hypercoag panel, TEE and loop)   Reviewed records from nephrology. PMHx diabetic retinopathy, diabetes, HTN, anxiety, chronic renal failure,neuropathy. BUN 35 and creatinine 2.15 07/2016. No ldl or hgba1c in notes received 10/28/2016 [facility]  HPI:  Jeff Silva is a 56 y.o. male here as a referral from Dr. Sherwood Gambler for headaches, muscle wasting. Past medical history hypertension, chronic kidney disease, type 2 diabetes, anxiety, muscle wasting, headache, history of falls, severe diabetic retinopathy, left shoulder and neck pain, balance problems. Patient found out he was diabetic when his eyesight was declining, he was diagnosed with severe diabetic retinopathy. He was having dizziness due to elevated blood pressure > 200 about a year ago had several falls and since then has fallen and hit his forehead and bent his neck back and heard vertebra pop about 8 months ago and then landed on his left shoulder. Even before that fall he started noticing wasting if both arms distally, he has progressive weakness and muscle weakness, can't pick anything up and steadily worse in the last 6 months. Left hand is worse  than the right. He has neuropathy from the diabetes in the feet and hands.he is left handed and he can;t even play the guitar anymore, weakened grip. He has baseline neuropathy but he has additional numbness in the left hand in the pinky and ring finger. More weakness in those digits as well. He still has dizziness and weakness and headaches and he is wobbly with imbalance. He has chronic neck pain from the shoulder to the arm. He has difficulty swallowing, he is coughing a lot, he has some muscle spasms on the shoulders and his left hand spasms. Wife can see his muscles jumping in the legs, occ in the arms as well. He had lost weight but he has gained it back and his weight has been stable at about 150 (he has lost a lot of weight due to diabetes and he was 120). Mother had a stroke but no FHx of neuromuscular disease.   Reviewed notes, labs and imaging from outside physicians, which showed:   Review primary care records. Patient complaining of neck pain due to a fall that occurred several months ago. Patient's of the cup better for a few days but recently within the last few weeks has been hurting more. He expressed balance issues and ambulates with a cane. Patient has been dropping things with his left arm. Patient also feels he has had a stroke in the past which cause muscle loss. He presents to clinic 07/28/2016 after presenting with a fall a few weeks back. Currently having left shoulder pain, left lower extremity weakness. Also bilateral hand muscles are weakening. He noted muscle wasting and atrophy.  Cholesterol 398, triglycerides 1187, hemoglobin A1c 7.5, creatinine 2.4, BUN 46 labs  drawn 04/14/2016.TSH 2.440 10/24/2014.      Review of Systems: Patient complains of symptoms per HPI as well as the following symptoms: Fatigue, blurred vision, eye pain, palpitations, feeling cold, ringing in ears, spinning sensation, diarrhea, constipation, joint pain, joint swelling, cramps, aching muscles,  birthmarks, impotence, runny nose, skin sensitivity, memory loss, insomnia, headache, numbness, weakness, difficulty swallowing, dizziness, passing out, tremor, depression, anxiety, not enough sleep, decreased energy, disinterest in activities. Pertinent negatives per HPI. All others negative.  Social History   Social History  . Marital status: Married    Spouse name: Jeff Silva  . Number of children: 0  . Years of education: 12   Occupational History  . Unable to work    Social History Main Topics  . Smoking status: Former Smoker    Packs/day: 0.25    Years: 25.00    Types: Cigarettes    Quit date: 05/31/2014  . Smokeless tobacco: Never Used  . Alcohol use Yes     Comment: occassional  . Drug use: Yes    Types: Marijuana     Comment: last few days  . Sexual activity: Yes    Birth control/ protection: None   Other Topics Concern  . Not on file   Social History Narrative   Lives w/ wife   Caffeine use: none   Eats/writes left-handed    Family History  Problem Relation Age of Onset  . Neuromuscular disorder Neg Hx     Past Medical History:  Diagnosis Date  . Anxiety   . Chronic kidney disease    stage 3 kidney disease  . Diabetes mellitus without complication (HCC)   . History of kidney stones   . Hypertension   . Neuropathy (HCC)   . Retinopathy     Past Surgical History:  Procedure Laterality Date  . CATARACT EXTRACTION W/PHACO Left 12/03/2014   Procedure: CATARACT EXTRACTION PHACO AND INTRAOCULAR LENS PLACEMENT (IOC);  Surgeon: Susa Simmondsarroll F Haines, MD;  Location: [facility];  Service: Ophthalmology;  Laterality: Left;  CDE:23.07  . CATARACT EXTRACTION W/PHACO Right 04/20/2016   Procedure: CATARACT EXTRACTION PHACO AND INTRAOCULAR LENS PLACEMENT (IOC);  Surgeon: Susa Simmondsarroll F Haines, MD;  Location: [facility];  Service: Ophthalmology;  Laterality: Right;  CDE: 7.40  . LITHOTRIPSY    . SHOULDER ARTHROSCOPY Right 1999  . TONSILLECTOMY     Around 7th grade    Current  Outpatient Prescriptions  Medication Sig Dispense Refill  . ALPRAZolam (XANAX) 1 MG tablet Take 1 mg by mouth 3 (three) times daily as needed for anxiety.    Marland Kitchen. amitriptyline (ELAVIL) 25 MG tablet Take 25 mg by mouth as needed for sleep.    Marland Kitchen. aspirin EC 81 MG tablet Take 81 mg by mouth daily.    . cloNIDine (CATAPRES) 0.1 MG tablet Take 0.1 mg by mouth as needed.    . cloNIDine (CATAPRES) 0.2 MG tablet Take 0.2 mg by mouth as needed.    . Empagliflozin-Linagliptin (GLYXAMBI) 10-5 MG TABS Take 1 tablet by mouth daily.    . furosemide (LASIX) 20 MG tablet Take 20 mg by mouth daily.    Marland Kitchen. glyBURIDE (DIABETA) 5 MG tablet Take 5 mg by mouth daily with breakfast.    . lisinopril (PRINIVIL,ZESTRIL) 5 MG tablet Take 5 mg by mouth daily.    . metoprolol (LOPRESSOR) 50 MG tablet Take 50 mg by mouth 2 (two) times daily.    . Vitamin D, Ergocalciferol, (DRISDOL) 50000 units CAPS capsule Take 50,000 Units by mouth  every 7 (seven) days.     No current facility-administered medications for this visit.     Allergies as of 10/26/2016 - Review Complete 10/26/2016  Allergen Reaction Noted  . Codeine Nausea And Vomiting and Other (See Comments) 10/26/2014    Vitals: BP (!) 182/90   Pulse 80   Ht 5' 8.5" (1.74 m)   Wt 150 lb 8 oz (68.3 kg)   BMI 22.55 kg/m  Last Weight:  Wt Readings from Last 1 Encounters:  10/26/16 150 lb 8 oz (68.3 kg)   Last Height:   Ht Readings from Last 1 Encounters:  10/26/16 5' 8.5" (1.74 m)     Physical exam: Exam: Gen: NAD, conversant                  CV: RRR, no MRG. No Carotid Bruits. No peripheral edema, warm, nontender Eyes: Conjunctivae clear without exudates or hemorrhage  Neuro: Detailed Neurologic Exam  Speech:    Speech is normal; fluent and spontaneous with normal comprehension.  Cognition:    The patient is oriented to person, place, and time;     recent and remote memory intact;     language fluent;     normal attention, concentration,     fund  of knowledge Cranial Nerves:    The pupils are equal, round, and reactive to light. Attempted funduscopic exam could not visualize. Visual fields are full to finger confrontation. Slight dysconjugate gaze but Extraocular movements are intact. Trigeminal sensation is intact and the muscles of mastication are normal. The face is symmetric. The palate elevates in the midline. Hearing intact. Voice is normal. Shoulder shrug is normal. The tongue has normal motion without fasciculations.   Coordination:    Normal finger to nose and heel to shin..   Gait:    Heel-toe and tandem gait are intact with slight imbalance  Motor Observation:    Distal UE wasting most pronounced in the left FDI. No involuntary movements noted. No fasciculations noted. Tone:    Normal muscle tone.    Posture:    Posture is normal. normal erect    Strength: Left triceps 4/5, Left interossei, opponens and grip weakness >> right, mild bilateral LE prox weakness otherwise strength is V/V in the upper and lower limbs.      Sensation: Decreased distally to all modalities in the extremities. Sway on Romberg.     Reflex Exam:  DTR's:    Deep tendon reflexes in the lower extremities are hyporeflexic and normal in the upper extremities.   Toes:    The toes are downgoing bilaterally.   Clonus:    Clonus is absent    Assessment/Plan:  Patient returns today to discuss MRi brain which did show infarcts;  There are scattered small foci of cortical and subcortical diffusion abnormality in the right cerebral hemisphere which largely appear watershed in distribution(can be embolic), with the most notable cluster of infarcts in the right centrum semiovale. Reviewed images with wife and patient and discussed further workup including MRA of the head carotid dopplers, labs including hgba1c and lipids (if no etiology found may consider hypercoag panel), echo. No smoking, no alcohol use. His risk factors are CKD, HLD, HTN,  diabetes  MRA of the head I feel these may be embolic appearing strokes vs watershed: Embolic-appearing strokes, patient needs carotid dopplers, echocardiogram and possibly TEE and loop.Will refer to cardiology for eval and workup.  Aspirin 325 daily Will request labs from Dr. Fausto Skillern, may suggest a statin  Avoid hypotension if no etiology found may consider hypercoag panel as well  Orders Placed This Encounter  Procedures  . MR MRA HEAD WO CONTRAST  . Ambulatory referral to Cardiology    I had a long d/w patient about recent stroke, risk for recurrent stroke/TIAs, personally independently reviewed imaging studies and stroke evaluation results and answered questions.Continue ASA for secondary stroke prevention and maintain strict control of hypertension(avoid hypotension), diabetes with hemoglobin A1c goal below 6.5% and lipids with LDL cholesterol goal below 70 mg/dL.. I also advised the patient to eat a healthy diet with plenty of whole grains, cereals, fruits and vegetables, exercise regularly and maintain ideal body weight .Followup in the future with me in 2-3 months or call earlier if necessary.  Cc: Dr Fausto Skillern Cc: [name], MD   Naomie Dean, MD  [facility] [address] [place], [place] [address]  Phone [phone] Fax [phone]

## 2016-10-28 ENCOUNTER — Telehealth: Payer: Self-pay | Admitting: Neurology

## 2016-10-28 NOTE — Telephone Encounter (Signed)
I was on the phone with the patient for over 20 mins informing me that when he called the [facility] assistant program today to tell them that he has a MRI schedule again for 11/04/16 the informed him that the last MRI that he had was cover and it was a one time coverage.. They informed him that if he had this other MRI they submit the claim to the services for the blind. He informed them that they will not pay for it then the bill would come on him.Marland Kitchen. He could then file it with [facility] but having that chance that they might not cover it.Marland Kitchen. He that he was covered by the 100% [facility] discount for several different months and they informed him no just that one time.. So he does not know what to do.Marland Kitchen. He does have a hearing coming up in April for his disability to passably be able to be on Medicare/medicaid but he still is not sure if he will be able to get that..Marland Kitchen

## 2016-10-29 NOTE — Telephone Encounter (Signed)
Called pt w/ MD recommendations below. Pt is agreeable to proceed w/ cardiology work-up and to place MRA on hold until a later date due to expense at this time.

## 2016-10-29 NOTE — Telephone Encounter (Signed)
This test is quite important to look at the blood vessels of the head. [facility] can [facility]. I would recommend getting all the other workup first because if we find something there we can treat that. But I do think this MRA of the head is important. We can touch base after he gets the other workup completed. Please let him know. Thanks

## 2016-11-04 ENCOUNTER — Ambulatory Visit: Admitting: Cardiovascular Disease

## 2016-11-04 ENCOUNTER — Ambulatory Visit (HOSPITAL_COMMUNITY)

## 2016-11-05 ENCOUNTER — Telehealth: Payer: Self-pay | Admitting: Neurology

## 2016-11-05 NOTE — Telephone Encounter (Signed)
Pt called want to know if nephrolgist has been contacted to determine what blood test he needs. Says [facility]. Pt says he has Dr appt tomorrow but can LVM.

## 2016-11-07 NOTE — Telephone Encounter (Signed)
[name] can we call Dr. Fausto SkillernBefakadu. Patient can get labwork through [facility] and I need a hgba1c and a fasting lipid pane if it hasn't been done in the last 3-4 monthsl. He gets labs done through Dr. Fausto SkillernBefakadu at a reduced cost. I was wondering if Dr. Fausto SkillernBefakadu could add these at next lab draw, apparently he gets labs done every several months through his office. Maybe we can call over there next week please? Thank you

## 2016-11-09 NOTE — Telephone Encounter (Signed)
Called Dr. Carlynn SpryBefakadu's office. Gave verbal order for HgbA1C and fasting lipid panel which will be added on to pt's [facility] lab draw.

## 2016-11-23 ENCOUNTER — Ambulatory Visit: Admitting: Cardiology

## 2017-08-13 DIAGNOSIS — N183 Chronic kidney disease, stage 3 (moderate): Secondary | ICD-10-CM | POA: Diagnosis not present

## 2017-08-13 DIAGNOSIS — R809 Proteinuria, unspecified: Secondary | ICD-10-CM | POA: Diagnosis not present

## 2017-08-13 DIAGNOSIS — E559 Vitamin D deficiency, unspecified: Secondary | ICD-10-CM | POA: Diagnosis not present

## 2017-08-13 DIAGNOSIS — Z79899 Other long term (current) drug therapy: Secondary | ICD-10-CM | POA: Diagnosis not present

## 2017-08-13 DIAGNOSIS — E7800 Pure hypercholesterolemia, unspecified: Secondary | ICD-10-CM | POA: Diagnosis not present

## 2017-08-13 DIAGNOSIS — D509 Iron deficiency anemia, unspecified: Secondary | ICD-10-CM | POA: Diagnosis not present

## 2017-08-13 DIAGNOSIS — I10 Essential (primary) hypertension: Secondary | ICD-10-CM | POA: Diagnosis not present

## 2017-08-13 DIAGNOSIS — E1165 Type 2 diabetes mellitus with hyperglycemia: Secondary | ICD-10-CM | POA: Diagnosis not present

## 2017-08-18 DIAGNOSIS — N183 Chronic kidney disease, stage 3 (moderate): Secondary | ICD-10-CM | POA: Diagnosis not present

## 2017-08-18 DIAGNOSIS — R809 Proteinuria, unspecified: Secondary | ICD-10-CM | POA: Diagnosis not present

## 2017-08-18 DIAGNOSIS — N2581 Secondary hyperparathyroidism of renal origin: Secondary | ICD-10-CM | POA: Diagnosis not present

## 2017-08-18 DIAGNOSIS — E871 Hypo-osmolality and hyponatremia: Secondary | ICD-10-CM | POA: Diagnosis not present

## 2017-08-18 DIAGNOSIS — E559 Vitamin D deficiency, unspecified: Secondary | ICD-10-CM | POA: Diagnosis not present

## 2017-08-20 DIAGNOSIS — R69 Illness, unspecified: Secondary | ICD-10-CM | POA: Diagnosis not present

## 2017-08-20 DIAGNOSIS — N183 Chronic kidney disease, stage 3 (moderate): Secondary | ICD-10-CM | POA: Diagnosis not present

## 2017-08-20 DIAGNOSIS — Z79899 Other long term (current) drug therapy: Secondary | ICD-10-CM | POA: Diagnosis not present

## 2017-08-20 DIAGNOSIS — I129 Hypertensive chronic kidney disease with stage 1 through stage 4 chronic kidney disease, or unspecified chronic kidney disease: Secondary | ICD-10-CM | POA: Diagnosis not present

## 2017-08-20 DIAGNOSIS — E1065 Type 1 diabetes mellitus with hyperglycemia: Secondary | ICD-10-CM | POA: Diagnosis not present

## 2017-08-20 DIAGNOSIS — E559 Vitamin D deficiency, unspecified: Secondary | ICD-10-CM | POA: Diagnosis not present

## 2017-08-20 DIAGNOSIS — E211 Secondary hyperparathyroidism, not elsewhere classified: Secondary | ICD-10-CM | POA: Diagnosis not present

## 2017-08-20 DIAGNOSIS — E11610 Type 2 diabetes mellitus with diabetic neuropathic arthropathy: Secondary | ICD-10-CM | POA: Diagnosis not present

## 2017-11-19 DIAGNOSIS — R809 Proteinuria, unspecified: Secondary | ICD-10-CM | POA: Diagnosis not present

## 2017-11-19 DIAGNOSIS — E559 Vitamin D deficiency, unspecified: Secondary | ICD-10-CM | POA: Diagnosis not present

## 2017-11-19 DIAGNOSIS — E1165 Type 2 diabetes mellitus with hyperglycemia: Secondary | ICD-10-CM | POA: Diagnosis not present

## 2017-11-19 DIAGNOSIS — D509 Iron deficiency anemia, unspecified: Secondary | ICD-10-CM | POA: Diagnosis not present

## 2017-11-19 DIAGNOSIS — N183 Chronic kidney disease, stage 3 (moderate): Secondary | ICD-10-CM | POA: Diagnosis not present

## 2017-11-19 DIAGNOSIS — I10 Essential (primary) hypertension: Secondary | ICD-10-CM | POA: Diagnosis not present

## 2017-11-19 DIAGNOSIS — E7800 Pure hypercholesterolemia, unspecified: Secondary | ICD-10-CM | POA: Diagnosis not present

## 2017-11-19 DIAGNOSIS — Z79899 Other long term (current) drug therapy: Secondary | ICD-10-CM | POA: Diagnosis not present

## 2017-11-24 DIAGNOSIS — E559 Vitamin D deficiency, unspecified: Secondary | ICD-10-CM | POA: Diagnosis not present

## 2017-11-24 DIAGNOSIS — D638 Anemia in other chronic diseases classified elsewhere: Secondary | ICD-10-CM | POA: Diagnosis not present

## 2017-11-24 DIAGNOSIS — N184 Chronic kidney disease, stage 4 (severe): Secondary | ICD-10-CM | POA: Diagnosis not present

## 2017-11-24 DIAGNOSIS — R809 Proteinuria, unspecified: Secondary | ICD-10-CM | POA: Diagnosis not present

## 2017-11-25 DIAGNOSIS — E559 Vitamin D deficiency, unspecified: Secondary | ICD-10-CM | POA: Diagnosis not present

## 2017-11-25 DIAGNOSIS — I10 Essential (primary) hypertension: Secondary | ICD-10-CM | POA: Diagnosis not present

## 2017-11-25 DIAGNOSIS — E875 Hyperkalemia: Secondary | ICD-10-CM | POA: Diagnosis not present

## 2017-11-25 DIAGNOSIS — Z79899 Other long term (current) drug therapy: Secondary | ICD-10-CM | POA: Diagnosis not present

## 2017-11-25 DIAGNOSIS — E782 Mixed hyperlipidemia: Secondary | ICD-10-CM | POA: Diagnosis not present

## 2017-11-25 DIAGNOSIS — R69 Illness, unspecified: Secondary | ICD-10-CM | POA: Diagnosis not present

## 2017-11-25 DIAGNOSIS — R531 Weakness: Secondary | ICD-10-CM | POA: Diagnosis not present

## 2017-11-25 DIAGNOSIS — N184 Chronic kidney disease, stage 4 (severe): Secondary | ICD-10-CM | POA: Diagnosis not present

## 2017-11-25 DIAGNOSIS — E118 Type 2 diabetes mellitus with unspecified complications: Secondary | ICD-10-CM | POA: Diagnosis not present

## 2017-11-25 DIAGNOSIS — G4700 Insomnia, unspecified: Secondary | ICD-10-CM | POA: Diagnosis not present

## 2017-12-21 DIAGNOSIS — H43813 Vitreous degeneration, bilateral: Secondary | ICD-10-CM | POA: Diagnosis not present

## 2017-12-21 DIAGNOSIS — E113513 Type 2 diabetes mellitus with proliferative diabetic retinopathy with macular edema, bilateral: Secondary | ICD-10-CM | POA: Diagnosis not present

## 2018-01-18 DIAGNOSIS — E118 Type 2 diabetes mellitus with unspecified complications: Secondary | ICD-10-CM | POA: Diagnosis not present

## 2018-01-18 DIAGNOSIS — R69 Illness, unspecified: Secondary | ICD-10-CM | POA: Diagnosis not present

## 2018-01-18 DIAGNOSIS — Z79899 Other long term (current) drug therapy: Secondary | ICD-10-CM | POA: Diagnosis not present

## 2018-02-01 DIAGNOSIS — H43813 Vitreous degeneration, bilateral: Secondary | ICD-10-CM | POA: Diagnosis not present

## 2018-02-01 DIAGNOSIS — E113513 Type 2 diabetes mellitus with proliferative diabetic retinopathy with macular edema, bilateral: Secondary | ICD-10-CM | POA: Diagnosis not present

## 2018-03-10 DIAGNOSIS — E039 Hypothyroidism, unspecified: Secondary | ICD-10-CM | POA: Diagnosis not present

## 2018-03-10 DIAGNOSIS — E119 Type 2 diabetes mellitus without complications: Secondary | ICD-10-CM | POA: Diagnosis not present

## 2018-03-10 DIAGNOSIS — R5383 Other fatigue: Secondary | ICD-10-CM | POA: Diagnosis not present

## 2018-03-10 DIAGNOSIS — E785 Hyperlipidemia, unspecified: Secondary | ICD-10-CM | POA: Diagnosis not present

## 2018-03-15 DIAGNOSIS — E039 Hypothyroidism, unspecified: Secondary | ICD-10-CM | POA: Diagnosis not present

## 2018-03-15 DIAGNOSIS — E118 Type 2 diabetes mellitus with unspecified complications: Secondary | ICD-10-CM | POA: Diagnosis not present

## 2018-03-15 DIAGNOSIS — I639 Cerebral infarction, unspecified: Secondary | ICD-10-CM | POA: Diagnosis not present

## 2018-03-15 DIAGNOSIS — E782 Mixed hyperlipidemia: Secondary | ICD-10-CM | POA: Diagnosis not present

## 2018-03-15 DIAGNOSIS — I10 Essential (primary) hypertension: Secondary | ICD-10-CM | POA: Diagnosis not present

## 2018-03-29 DIAGNOSIS — H43813 Vitreous degeneration, bilateral: Secondary | ICD-10-CM | POA: Diagnosis not present

## 2018-03-29 DIAGNOSIS — E113513 Type 2 diabetes mellitus with proliferative diabetic retinopathy with macular edema, bilateral: Secondary | ICD-10-CM | POA: Diagnosis not present

## 2018-04-26 DIAGNOSIS — I10 Essential (primary) hypertension: Secondary | ICD-10-CM | POA: Diagnosis not present

## 2018-04-26 DIAGNOSIS — R69 Illness, unspecified: Secondary | ICD-10-CM | POA: Diagnosis not present

## 2018-04-26 DIAGNOSIS — E118 Type 2 diabetes mellitus with unspecified complications: Secondary | ICD-10-CM | POA: Diagnosis not present

## 2018-05-31 DIAGNOSIS — E113513 Type 2 diabetes mellitus with proliferative diabetic retinopathy with macular edema, bilateral: Secondary | ICD-10-CM | POA: Diagnosis not present

## 2018-05-31 DIAGNOSIS — H43813 Vitreous degeneration, bilateral: Secondary | ICD-10-CM | POA: Diagnosis not present

## 2018-05-31 DIAGNOSIS — H3582 Retinal ischemia: Secondary | ICD-10-CM | POA: Diagnosis not present

## 2018-06-02 DIAGNOSIS — E119 Type 2 diabetes mellitus without complications: Secondary | ICD-10-CM | POA: Diagnosis not present

## 2018-06-02 DIAGNOSIS — I10 Essential (primary) hypertension: Secondary | ICD-10-CM | POA: Diagnosis not present

## 2018-06-07 DIAGNOSIS — E039 Hypothyroidism, unspecified: Secondary | ICD-10-CM | POA: Diagnosis not present

## 2018-06-07 DIAGNOSIS — E1165 Type 2 diabetes mellitus with hyperglycemia: Secondary | ICD-10-CM | POA: Diagnosis not present

## 2018-06-07 DIAGNOSIS — E11311 Type 2 diabetes mellitus with unspecified diabetic retinopathy with macular edema: Secondary | ICD-10-CM | POA: Diagnosis not present

## 2018-06-07 DIAGNOSIS — E782 Mixed hyperlipidemia: Secondary | ICD-10-CM | POA: Diagnosis not present

## 2018-06-07 DIAGNOSIS — I10 Essential (primary) hypertension: Secondary | ICD-10-CM | POA: Diagnosis not present

## 2018-06-07 DIAGNOSIS — Z7984 Long term (current) use of oral hypoglycemic drugs: Secondary | ICD-10-CM | POA: Diagnosis not present

## 2018-06-07 DIAGNOSIS — R69 Illness, unspecified: Secondary | ICD-10-CM | POA: Diagnosis not present

## 2018-06-07 DIAGNOSIS — Z79899 Other long term (current) drug therapy: Secondary | ICD-10-CM | POA: Diagnosis not present

## 2018-07-11 ENCOUNTER — Encounter (HOSPITAL_COMMUNITY): Payer: Self-pay | Admitting: Emergency Medicine

## 2018-07-11 ENCOUNTER — Other Ambulatory Visit: Payer: Self-pay

## 2018-07-11 ENCOUNTER — Emergency Department (HOSPITAL_COMMUNITY): Payer: Medicare HMO

## 2018-07-11 ENCOUNTER — Emergency Department (HOSPITAL_COMMUNITY)
Admission: EM | Admit: 2018-07-11 | Discharge: 2018-07-11 | Disposition: A | Discharge status: Home / Self Care | Payer: Medicare HMO | Attending: Emergency Medicine | Admitting: Emergency Medicine

## 2018-07-11 DIAGNOSIS — R739 Hyperglycemia, unspecified: Secondary | ICD-10-CM

## 2018-07-11 DIAGNOSIS — I10 Essential (primary) hypertension: Secondary | ICD-10-CM

## 2018-07-11 DIAGNOSIS — N289 Disorder of kidney and ureter, unspecified: Secondary | ICD-10-CM

## 2018-07-11 DIAGNOSIS — Z87891 Personal history of nicotine dependence: Secondary | ICD-10-CM | POA: Diagnosis not present

## 2018-07-11 DIAGNOSIS — I129 Hypertensive chronic kidney disease with stage 1 through stage 4 chronic kidney disease, or unspecified chronic kidney disease: Secondary | ICD-10-CM | POA: Diagnosis not present

## 2018-07-11 DIAGNOSIS — R51 Headache: Secondary | ICD-10-CM | POA: Diagnosis present

## 2018-07-11 DIAGNOSIS — R339 Retention of urine, unspecified: Secondary | ICD-10-CM

## 2018-07-11 DIAGNOSIS — Z7982 Long term (current) use of aspirin: Secondary | ICD-10-CM | POA: Diagnosis not present

## 2018-07-11 DIAGNOSIS — N183 Chronic kidney disease, stage 3 (moderate): Secondary | ICD-10-CM | POA: Diagnosis not present

## 2018-07-11 DIAGNOSIS — Z79899 Other long term (current) drug therapy: Secondary | ICD-10-CM | POA: Insufficient documentation

## 2018-07-11 DIAGNOSIS — R519 Headache, unspecified: Secondary | ICD-10-CM

## 2018-07-11 DIAGNOSIS — E875 Hyperkalemia: Secondary | ICD-10-CM

## 2018-07-11 DIAGNOSIS — E1122 Type 2 diabetes mellitus with diabetic chronic kidney disease: Secondary | ICD-10-CM | POA: Insufficient documentation

## 2018-07-11 HISTORY — DX: Cerebral infarction, unspecified: I63.9

## 2018-07-11 LAB — COMPREHENSIVE METABOLIC PANEL
ALT: 32 U/L (ref 0–44)
ANION GAP: 7 (ref 5–15)
AST: 21 U/L (ref 15–41)
Albumin: 3.8 g/dL (ref 3.5–5.0)
Alkaline Phosphatase: 62 U/L (ref 38–126)
BILIRUBIN TOTAL: 1 mg/dL (ref 0.3–1.2)
BUN: 34 mg/dL — AB (ref 6–20)
CO2: 25 mmol/L (ref 22–32)
Calcium: 8.4 mg/dL — ABNORMAL LOW (ref 8.9–10.3)
Chloride: 102 mmol/L (ref 98–111)
Creatinine, Ser: 2.91 mg/dL — ABNORMAL HIGH (ref 0.61–1.24)
GFR calc Af Amer: 26 mL/min — ABNORMAL LOW (ref >60)
GFR, EST NON AFRICAN AMERICAN: 23 mL/min — AB (ref >60)
Glucose, Bld: 317 mg/dL — ABNORMAL HIGH (ref 70–99)
POTASSIUM: 5.6 mmol/L — AB (ref 3.5–5.1)
Sodium: 134 mmol/L — ABNORMAL LOW (ref 135–145)
TOTAL PROTEIN: 7.9 g/dL (ref 6.5–8.1)

## 2018-07-11 LAB — URINALYSIS, ROUTINE W REFLEX MICROSCOPIC
BILIRUBIN URINE: NEGATIVE
Glucose, UA: >=500 mg/dL — AB
KETONES UR: NEGATIVE mg/dL
LEUKOCYTES UA: NEGATIVE
Nitrite: NEGATIVE
PH: 6 (ref 5.0–8.0)
PROTEIN: 100 mg/dL — AB
Specific Gravity, Urine: 1.014 (ref 1.005–1.030)

## 2018-07-11 LAB — DIFFERENTIAL
ABS IMMATURE GRANULOCYTES: 0.03 10*3/uL (ref 0.00–0.07)
Basophils Absolute: 0.1 10*3/uL (ref 0.0–0.1)
Basophils Relative: 1 %
EOS ABS: 0.2 10*3/uL (ref 0.0–0.5)
Eosinophils Relative: 2 %
IMMATURE GRANULOCYTES: 0 %
Lymphocytes Relative: 10 %
Lymphs Abs: 1 10*3/uL (ref 0.7–4.0)
MONOS PCT: 5 %
Monocytes Absolute: 0.5 10*3/uL (ref 0.1–1.0)
NEUTROS PCT: 82 %
Neutro Abs: 8.4 10*3/uL — ABNORMAL HIGH (ref 1.7–7.7)

## 2018-07-11 LAB — CBC
HCT: 42.8 % (ref 39.0–52.0)
Hemoglobin: 14 g/dL (ref 13.0–17.0)
MCH: 29.5 pg (ref 26.0–34.0)
MCHC: 32.7 g/dL (ref 30.0–36.0)
MCV: 90.3 fL (ref 80.0–100.0)
NRBC: 0 % (ref 0.0–0.2)
PLATELETS: 257 10*3/uL (ref 150–400)
RBC: 4.74 MIL/uL (ref 4.22–5.81)
RDW: 12.3 % (ref 11.5–15.5)
WBC: 10.2 10*3/uL (ref 4.0–10.5)

## 2018-07-11 LAB — RAPID URINE DRUG SCREEN, HOSP PERFORMED
Amphetamines: NOT DETECTED
BENZODIAZEPINES: POSITIVE — AB
Barbiturates: NOT DETECTED
COCAINE: NOT DETECTED
OPIATES: NOT DETECTED
Tetrahydrocannabinol: POSITIVE — AB

## 2018-07-11 LAB — I-STAT TROPONIN, ED: TROPONIN I, POC: 0 ng/mL (ref 0.00–0.08)

## 2018-07-11 LAB — APTT: APTT: 31 s (ref 24–36)

## 2018-07-11 LAB — PROTIME-INR
INR: 0.99
PROTHROMBIN TIME: 13 s (ref 11.4–15.2)

## 2018-07-11 LAB — ETHANOL: Alcohol, Ethyl (B): <10 mg/dL (ref <10)

## 2018-07-11 MED ORDER — DIPHENHYDRAMINE HCL 50 MG/ML IJ SOLN
12.5000 mg | Freq: Once | INTRAMUSCULAR | Status: AC
  Administered 2018-07-11: 12.5 mg via INTRAVENOUS
  Filled 2018-07-11: qty 1

## 2018-07-11 MED ORDER — FENTANYL CITRATE (PF) 100 MCG/2ML IJ SOLN
50.0000 ug | Freq: Once | INTRAMUSCULAR | Status: AC
  Administered 2018-07-11: 50 ug via INTRAVENOUS
  Filled 2018-07-11: qty 2

## 2018-07-11 MED ORDER — LABETALOL HCL 5 MG/ML IV SOLN
20.0000 mg | Freq: Once | INTRAVENOUS | Status: AC
  Administered 2018-07-11: 20 mg via INTRAVENOUS
  Filled 2018-07-11: qty 4

## 2018-07-11 MED ORDER — AMLODIPINE BESYLATE 5 MG PO TABS
5.0000 mg | ORAL_TABLET | Freq: Once | ORAL | Status: AC
  Administered 2018-07-11: 5 mg via ORAL
  Filled 2018-07-11: qty 1

## 2018-07-11 MED ORDER — PROCHLORPERAZINE EDISYLATE 10 MG/2ML IJ SOLN
10.0000 mg | Freq: Once | INTRAMUSCULAR | Status: AC
  Administered 2018-07-11: 10 mg via INTRAVENOUS
  Filled 2018-07-11: qty 2

## 2018-07-11 MED ORDER — AMLODIPINE BESYLATE 5 MG PO TABS: 5.0000 mg | ORAL_TABLET | Freq: Every day | ORAL | 0 refills | Status: AC

## 2018-07-11 NOTE — ED Notes (Signed)
EKG handed to Dr Cook 

## 2018-07-11 NOTE — ED Notes (Signed)
Reminded pt we need a urine sample. Urinal at bedside. Cannot give sample at this time. Will notify us when he is able to give a sample.

## 2018-07-11 NOTE — ED Notes (Signed)
Pt voided and post void residual was . Dr Effie Shy notified.

## 2018-07-11 NOTE — ED Triage Notes (Signed)
Pt states he believes he has had a stroke. HA, R hand went numb this morning, and "stumbling over words". States it started two days ago with neck and HA, numbness started this morning at 1030. Hx of ischemic stroke.

## 2018-07-11 NOTE — ED Provider Notes (Signed)
[facility] EMERGENCY DEPARTMENT Provider Note   CSN: [id number] Arrival date & time: 07/11/18  1346     History   Chief Complaint Chief Complaint  Patient presents with  . Headache    HPI Jeff Silva is a 57 y.o. male.  HPI Patient presents to the emergency room for evaluation of headache, speech difficulty and right hand weakness.  Patient states he started having a headache a couple of days ago.  Initially it was not that severe to so he did not think too much of it.  The headache has progressed in severity over the last couple of days and when he woke up this morning he noticed when he tried to open up a packet he had difficulty with his right hand.  It also felt somewhat numb.  His wife also thought that he was stumbling over his words.  Patient does have history of multiple medical problems including diabetes, chronic kidney disease, and prior strokes. Past Medical History:  Diagnosis Date  . Anxiety   . Chronic kidney disease    stage 3 kidney disease  . Diabetes mellitus without complication (HCC)   . History of kidney stones   . Hypertension   . Neuropathy   . Retinopathy   . Stroke [facility])     Patient Active Problem List   Diagnosis Date Noted  . Cervical disc disorder with radiculopathy of cervical region 09/03/2016  . Left arm weakness 09/03/2016  . Muscle wasting and atrophy, not elsewhere classified, left upper arm 09/03/2016    Past Surgical History:  Procedure Laterality Date  . CATARACT EXTRACTION W/PHACO Left 12/03/2014   Procedure: CATARACT EXTRACTION PHACO AND INTRAOCULAR LENS PLACEMENT (IOC);  Surgeon: Susa Simmonds, MD;  Location: [facility];  Service: Ophthalmology;  Laterality: Left;  CDE:23.07  . CATARACT EXTRACTION W/PHACO Right 04/20/2016   Procedure: CATARACT EXTRACTION PHACO AND INTRAOCULAR LENS PLACEMENT (IOC);  Surgeon: Susa Simmonds, MD;  Location: [facility];  Service: Ophthalmology;  Laterality: Right;  CDE: 7.40  . LITHOTRIPSY    .  SHOULDER ARTHROSCOPY Right 1999  . TONSILLECTOMY     Around 7th grade        Home Medications    Prior to Admission medications   Medication Sig Start Date End Date Taking? Authorizing Provider  ALPRAZolam Prudy Feeler) 1 MG tablet Take 1 mg by mouth 3 (three) times daily as needed for anxiety.   Yes [provider]  aspirin EC 81 MG tablet Take 81 mg by mouth daily.   Yes [provider]  Cinnamon 500 MG TABS Take 500 mg by mouth daily.   Yes [provider]  cloNIDine (CATAPRES) 0.1 MG tablet Take 0.1 mg by mouth daily as needed (for High Blood pressure levels).    Yes [provider]  cloNIDine (CATAPRES) 0.2 MG tablet Take 0.2 mg by mouth daily as needed (for High Blood pressure levels).    Yes [provider]  furosemide (LASIX) 20 MG tablet Take 20 mg by mouth daily.   Yes [provider]  glyBURIDE (DIABETA) 5 MG tablet Take 5 mg by mouth daily with breakfast.   Yes [provider]  hydrochlorothiazide (HYDRODIURIL) 50 MG tablet Take 50 mg by mouth every evening. 01/07/16  Yes [provider]  levothyroxine (SYNTHROID, LEVOTHROID) 25 MCG tablet Take 25 mcg by mouth every morning. 06/15/18  Yes [provider]  lisinopril (PRINIVIL,ZESTRIL) 5 MG tablet Take 5 mg by mouth daily.   Yes [provider]  metoprolol (LOPRESSOR) 50 MG tablet Take 50 mg by mouth 2 (two) times daily.   Yes [provider]  Vitamin D, Ergocalciferol, (DRISDOL) 50000 units CAPS capsule Take 50,000 Units by mouth every Wednesday.    Yes [provider]    Family History Family History  Problem Relation Age of Onset  . Neuromuscular disorder Neg Hx     Social History Social History   Tobacco Use  . Smoking status: Former Smoker    Packs/day: 0.25    Years: 25.00    Pack years: 6.25    Types: Cigarettes    Last attempt to quit: 05/31/2014    Years since quitting: 4.1  . Smokeless tobacco: Never Used    Substance Use Topics  . Alcohol use: Not Currently    Comment: occassional  . Drug use: Not Currently    Types: Marijuana    Comment: last few days     Allergies   Codeine   Review of Systems Review of Systems  All other systems reviewed and are negative.    Physical Exam Updated Vital Signs BP (!) 216/117   Pulse 81   Temp (!) 96.6 F (35.9 C) (Temporal)   Resp 16   Ht 1.727 m (5\' 8" )   Wt 72.6 kg   SpO2 100%   BMI 24.33 kg/m   Physical Exam  Constitutional: He is oriented to person, place, and time. He appears well-developed and well-nourished. No distress.  HENT:  Head: Normocephalic and atraumatic.  Right Ear: External ear normal.  Left Ear: External ear normal.  Mouth/Throat: Oropharynx is clear and moist.  Eyes: Conjunctivae are normal. Right eye exhibits no discharge. Left eye exhibits no discharge. No scleral icterus.  Neck: Neck supple. No tracheal deviation present.  Cardiovascular: Normal rate, regular rhythm and intact distal pulses.  Pulmonary/Chest: Effort normal and breath sounds normal. No stridor. No respiratory distress. He has no wheezes. He has no rales.  Abdominal: Soft. Bowel sounds are normal. He exhibits no distension. There is no tenderness. There is no rebound and no guarding.  Musculoskeletal: He exhibits no edema or tenderness.  Neurological: He is alert and oriented to person, place, and time. He has normal strength. No cranial nerve deficit (No facial droop, extraocular movements intact, tongue midline ) or sensory deficit. He exhibits normal muscle tone. He displays no seizure activity. Coordination normal.  No pronator drift bilateral upper extrem, able to hold both legs off bed for 5 seconds, sensation intact in all extremities, no visual field cuts, no left or right sided neglect, normal finger-nose exam bilaterally, no nystagmus noted   Skin: Skin is warm and dry. No rash noted.  Psychiatric: He has a normal mood and affect.   Nursing note and vitals reviewed.    ED Treatments / Results  Labs (all labs ordered are listed, but only abnormal results are displayed) Labs Reviewed  DIFFERENTIAL - Abnormal; Notable for the following components:      Result Value   Neutro Abs 8.4 (*)    All other components within normal limits  COMPREHENSIVE METABOLIC PANEL - Abnormal; Notable for the following components:   Sodium 134 (*)    Potassium 5.6 (*)    Glucose, Bld 317 (*)    BUN 34 (*)    Creatinine, Ser 2.91 (*)    Calcium 8.4 (*)    GFR calc non Af Amer 23 (*)    GFR calc Af Amer 26 (*)  All other components within normal limits  ETHANOL  PROTIME-INR  APTT  CBC  RAPID URINE DRUG SCREEN, HOSP PERFORMED  URINALYSIS, ROUTINE W REFLEX MICROSCOPIC  I-STAT TROPONIN, ED    EKG EKG Interpretation  Date/Time:  Monday July 11 2018 14:39:27 EST Ventricular Rate:  87 PR Interval:    QRS Duration: 84 QT Interval:  356 QTC Calculation: 429 R Axis:   -47 Text Interpretation:  Sinus rhythm Probable left atrial enlargement LAD, consider left anterior fascicular block Anterior infarct, old No significant change since last tracing Confirmed by Linwood Dibbles [phone]) on 07/11/2018 3:03:01 PM   Radiology No results found.  Procedures .Critical Care Performed by: Linwood Dibbles, MD Authorized by: Linwood Dibbles, MD   Critical care provider statement:    Critical care time (minutes):  30   Critical care was time spent personally by me on the following activities:  Discussions with consultants, evaluation of patient's response to treatment, examination of patient, ordering and performing treatments and interventions, ordering and review of laboratory studies, ordering and review of radiographic studies, pulse oximetry, re-evaluation of patient's condition, obtaining history from patient or surrogate and review of old charts   (including critical care time)  Medications Ordered in ED Medications  labetalol  (NORMODYNE,TRANDATE) injection 20 mg (has no administration in time range)  prochlorperazine (COMPAZINE) injection 10 mg (10 mg Intravenous Given 07/11/18 1449)  diphenhydrAMINE (BENADRYL) injection 12.5 mg (12.5 mg Intravenous Given 07/11/18 1452)  fentaNYL (SUBLIMAZE) injection 50 mcg (50 mcg Intravenous Given 07/11/18 1457)  labetalol (NORMODYNE,TRANDATE) injection 20 mg (20 mg Intravenous Given 07/11/18 1454)     Initial Impression / Assessment and Plan / ED Course  I have reviewed the triage vital signs and the nursing notes.  Pertinent labs & imaging results that were available during my care of the patient were reviewed by me and considered in my medical decision making (see chart for details).  Clinical Course as of Jul 11 1546  Mon Jul 11, 2018  1427 BP is severely elevated.  Will given medications for pain as well as labetalol   [JK]  1544 Creatinine is elevated compared to previous value.   [JK]  1545 Blood pressure remains elevated.  I have ordered additional dose of labetalol   [JK]  1545 CT scan is pending   [JK]    Clinical Course User Index [JK] Linwood Dibbles, MD    presents to the emergency room for evaluation of headache, neurologic complaints.  Has a history of prior strokes.  No focal deficits noted on exam right now however I am concerned about the possibility of stroke, TIA as well as a complex migraine.  CT scan is pending.  Blood pressure medications have been ordered and will need to be monitored closely.  May need MRI if CT is negative and may need admission if BP remains elevated.   Will turn over care to Dr Effie Shy.  Final Clinical Impressions(s) / ED Diagnoses   Final diagnoses:  Hypertension, unspecified type      Linwood Dibbles, MD 07/11/18 1547

## 2018-07-11 NOTE — Discharge Instructions (Addendum)
We are changing her blood pressure for now.  Stop taking the HCTZ, and furosemide.  Start taking the amlodipine, tomorrow.  A prescription was sent to your pharmacy.  Continue to stay on a low potassium diet.  Use Tylenol if needed for headache.  Return here if needed for problems.

## 2018-07-11 NOTE — ED Provider Notes (Signed)
18: 50-received from Dr. Lynelle Doctor to evaluate post return of testing.  Clinical Course as of Jul 11 2026  Mon Jul 11, 2018  1427 BP is severely elevated.  Will given medications for pain as well as labetalol   [JK]  1544 Creatinine is elevated compared to previous value.   [JK]  1545 Blood pressure remains elevated.  I have ordered additional dose of labetalol   [JK]  1545 CT scan is pending   [JK]  1856 Normal  I-stat troponin, ED (not at [facility]) [EW]  1856 Normal except sodium low, potassium high, glucose high, BUN high, creatinine high, calcium low, GFR low  Comprehensive metabolic panel(!) [EW]  1952 Blood pressure now 216/110.  Patient would like to try voiding to see if he could avoid Foley catheterization to treat urinary retention.   [EW]  2022 Patient voided 300 cc, postvoid residual around 200 according to nurse report.  At this time patient is comfortable.  He reports being hyperkalemic in the past.  He is currently on a low potassium diet.   [EW]  2027 No acute brain infarct, images reviewed by me  CT HEAD WO CONTRAST [EW]    Clinical Course User Index [EW] Mancel Bale, MD [JK] Linwood Dibbles, MD     Patient Vitals for the past 24 hrs:  BP Temp Temp src Pulse Resp SpO2 Height Weight  07/11/18 1900 (!) 177/98 - - 88 15 100 % - -  07/11/18 1830 (!) 151/70 - - - 17 - - -  07/11/18 1800 (!) 207/113 - - - 14 - - -  07/11/18 1730 (!) 189/102 - - - 16 - - -  07/11/18 1530 (!) 216/117 - - - 16 - - -  07/11/18 1500 (!) 223/121 - - - 16 - - -  07/11/18 1453 - - - - 19 - - -  07/11/18 1452 (!) 214/98 - - - 11 - - -  07/11/18 1421 - - - 81 18 100 % - -  07/11/18 1415 - - - 81 17 100 % - -  07/11/18 1351 (!) 218/149 (!) 96.6 F (35.9 C) Temporal 77 16 98 % 5\' 8"  (1.727 m) 72.6 kg    8:25 PM Reevaluation with update and discussion. After initial assessment and treatment, an updated evaluation reveals patient is comfortable now, findings discussed with patient and his wife,  all questions answered. Mancel Bale   Medical Decision Making: Headache with hypertension, despite usual treatment at home with lisinopril and Lasix.  Significant elevation in creatinine associated with 500 cc in urinary bladder.  Blood pressure transiently improved with labetalol.  Patient on Lasix which may be worsening his renal function.  States he has been on metoprolol in the past but cannot recall being on amlodipine.  He does use clonidine, PRN elevated blood pressure at home.  He follows with nephrology regarding his renal insufficiency.  Doubt CVA, hypertensive urgency or impending vascular collapse.  Plan-hold HCTZ and furosemide, for now.  Prescription for amlodipine given, first dose started in the ED.  Recommend outpatient follow-up with PCP regarding blood pressure, blood sugar, and urinary retention.  Recommend follow-up with renal regarding hyperkalemia and renal insufficiency  .Critical Care Performed by: Mancel Bale, MD Authorized by: Mancel Bale, MD   Critical care provider statement:    Critical care time (minutes):  35   Critical care start time:  07/11/2018 6:50 PM   Critical care end time:  07/11/2018 8:26 PM   Critical care  time was exclusive of:  Separately billable procedures and treating other patients   Critical care was necessary to treat or prevent imminent or life-threatening deterioration of the following conditions:  CNS failure or compromise   Critical care was time spent personally by me on the following activities:  Blood draw for specimens, development of treatment plan with patient or surrogate, discussions with consultants, evaluation of patient's response to treatment, examination of patient, obtaining history from patient or surrogate, ordering and performing treatments and interventions, ordering and review of laboratory studies, pulse oximetry, re-evaluation of patient's condition, review of old charts and ordering and review of radiographic  studies     CRITICAL CARE-yes Performed by: Kerrin Mo, MD 07/11/18 2031

## 2019-09-01 DEATH — deceased

## 2020-07-26 IMAGING — CT CT HEAD W/O CM
3 series · 15 of 46 positions shown, 18 images · non-contrast
Comparison: October 20, 2016

CLINICAL DATA: Headache with altered mental status

EXAM:
CT HEAD WITHOUT CONTRAST
TECHNIQUE: Contiguous axial images were obtained from the base of the skull
through the vertex without intravenous contrast.

[Series 2: head trauma wo · axial · 0.42mm/px · z∈[+1610,+1730]mm · 9 of 29 slices shown, 12 images]
[im 3/29  brain]
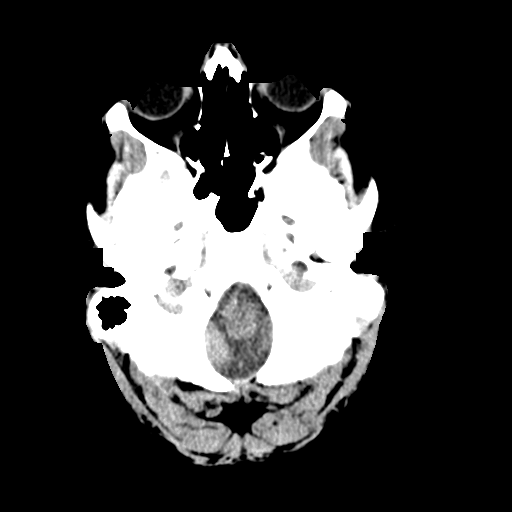
[im 3/29  bone]
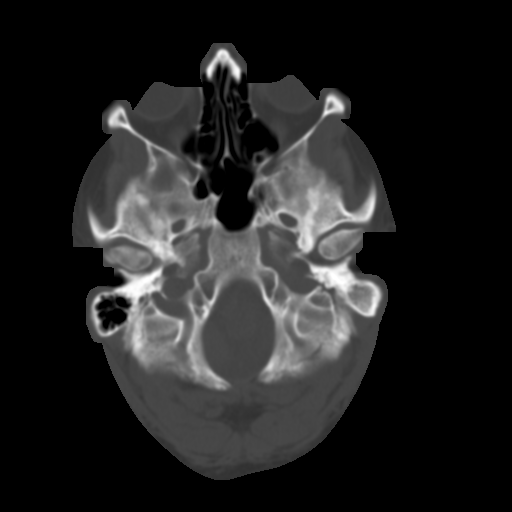
[im 6/29  brain]
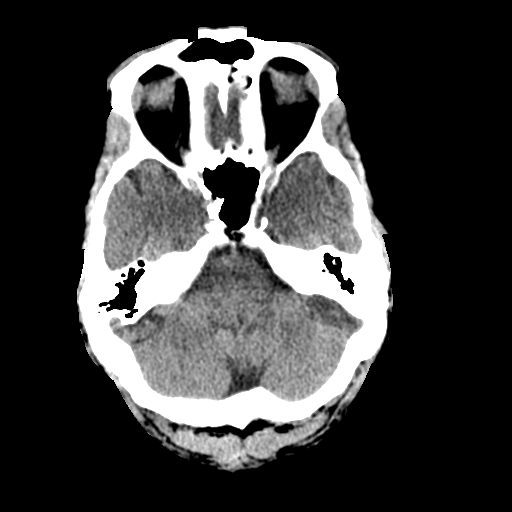
[im 9/29  brain]
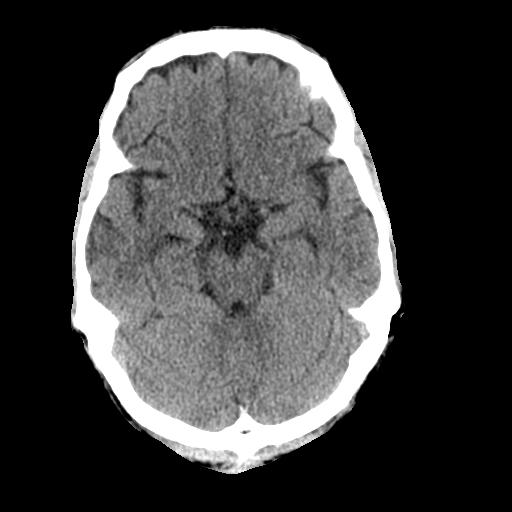
[im 12/29  brain]
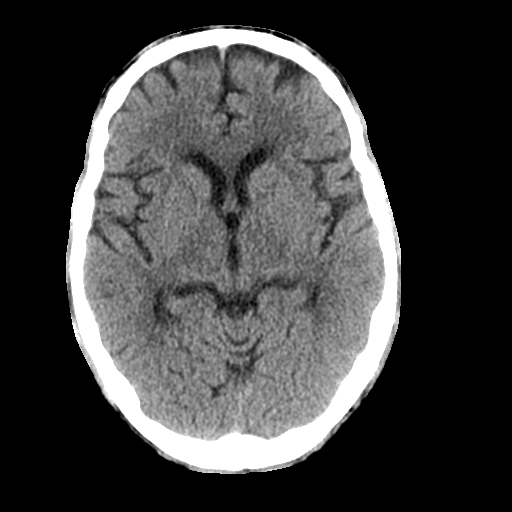
[im 15/29  brain]
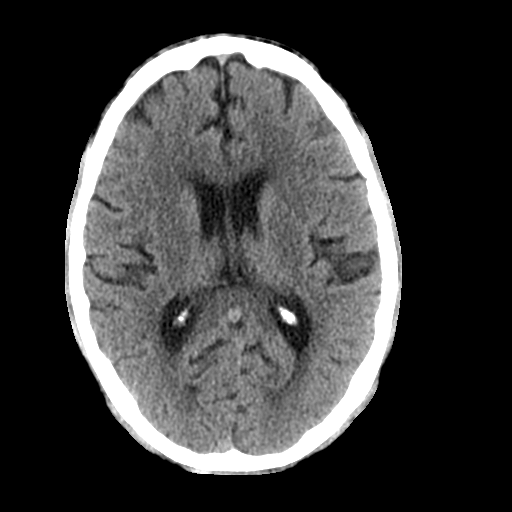
[im 15/29  bone]
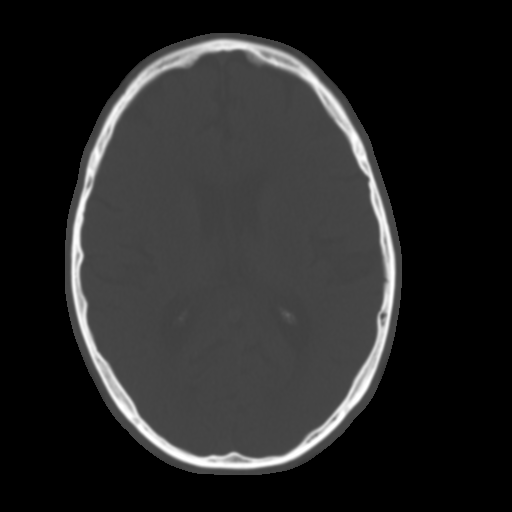
[im 18/29  brain]
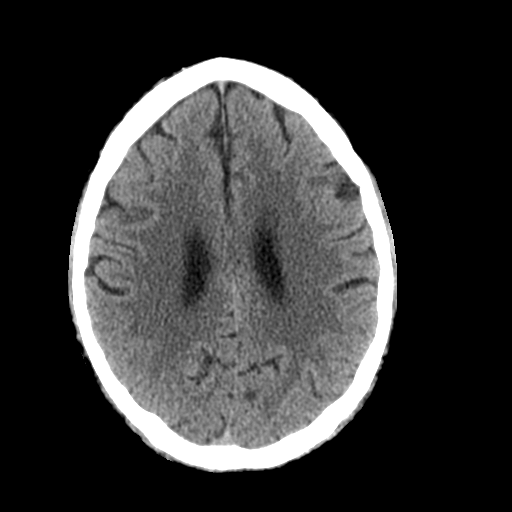
[im 21/29  brain]
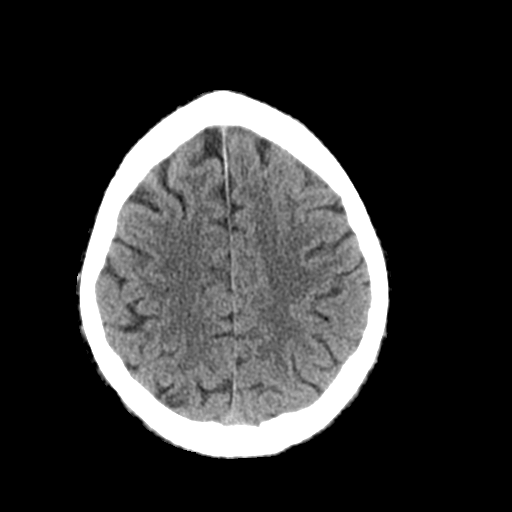
[im 24/29  brain]
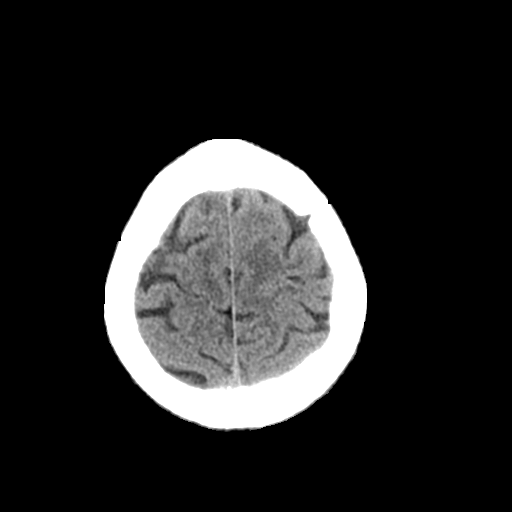
[im 27/29  brain]
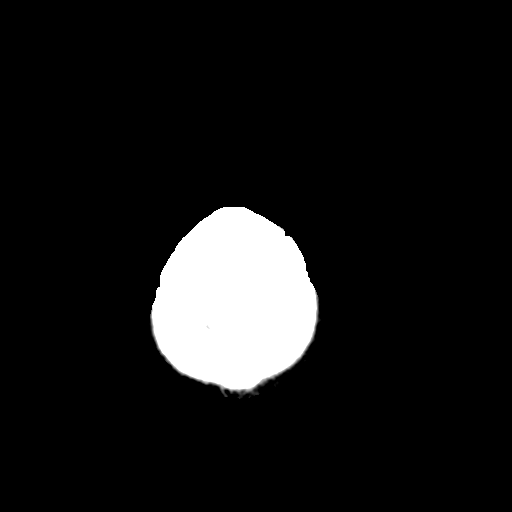
[im 27/29  bone]
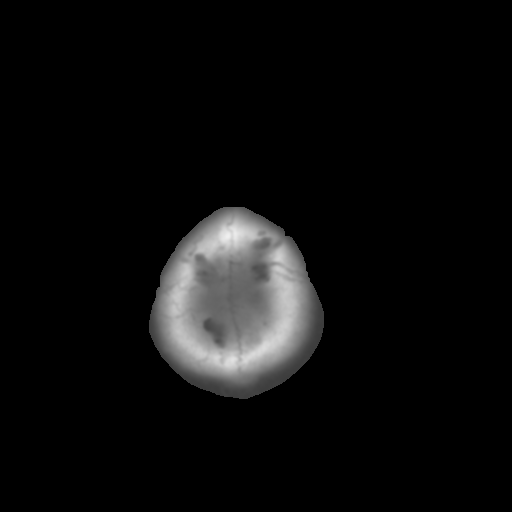

[Series 4: coronal soft tissue · coronal · 0.31mm/px · 3 of 70 slices shown]
[im 24/70  brain]
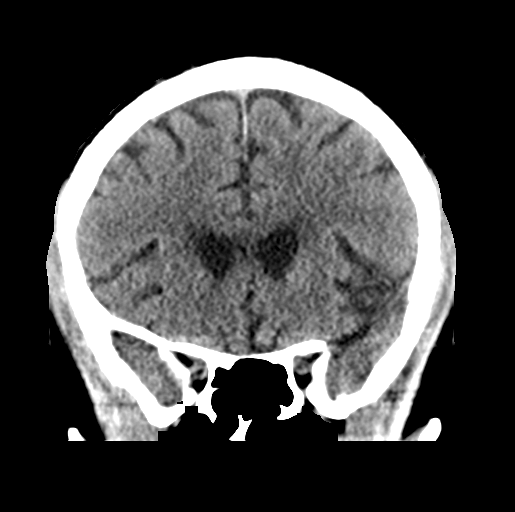
[im 31/70  brain]
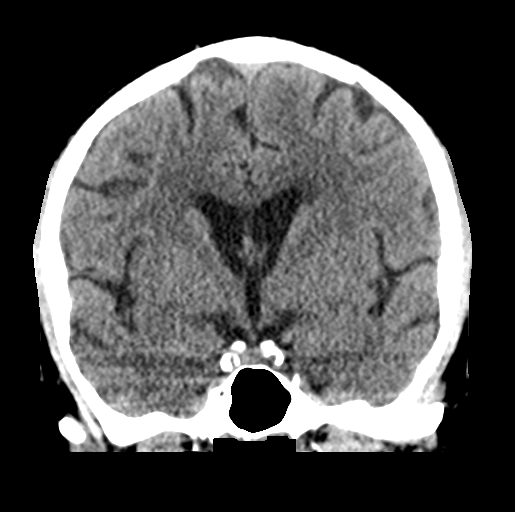
[im 39/70  brain]
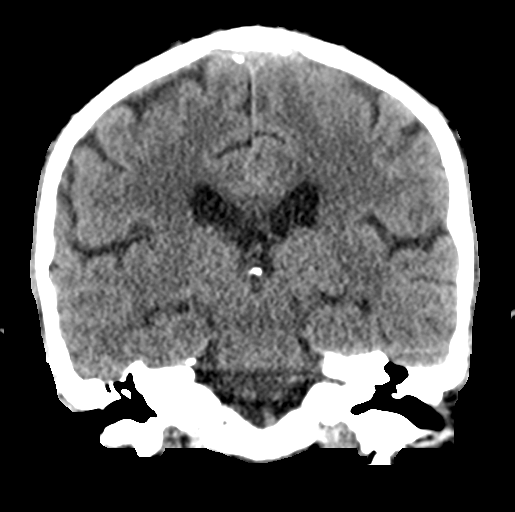

[Series 5: sagittal soft tissue · sagittal · 0.31mm/px · 3 of 58 slices shown]
[im 20/58  brain]
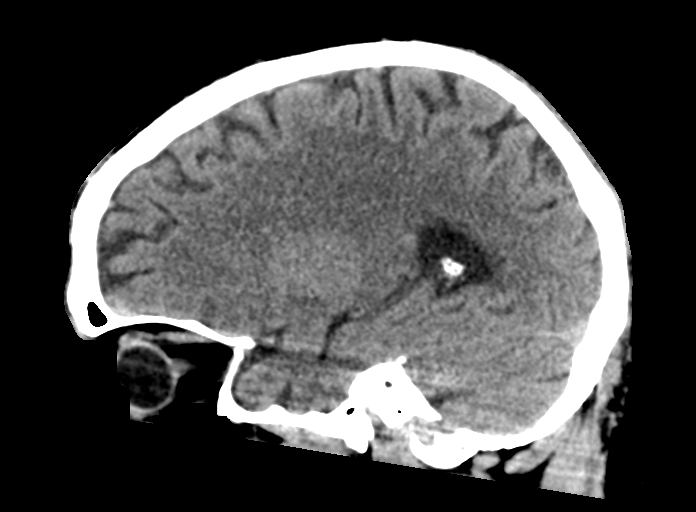
[im 29/58  brain]
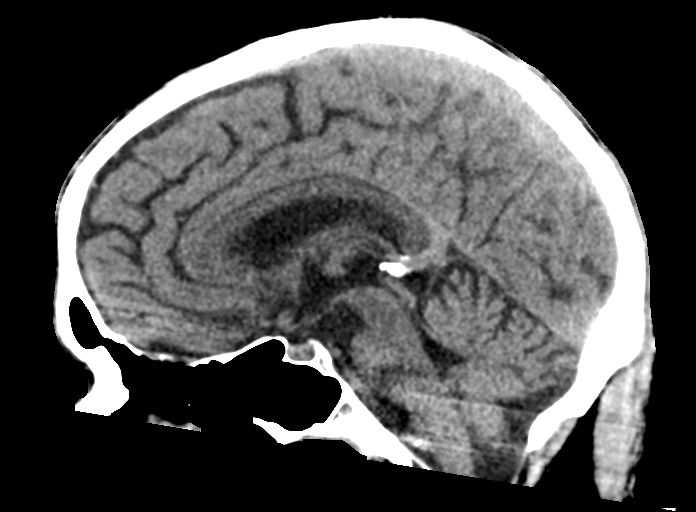
[im 39/58  brain]
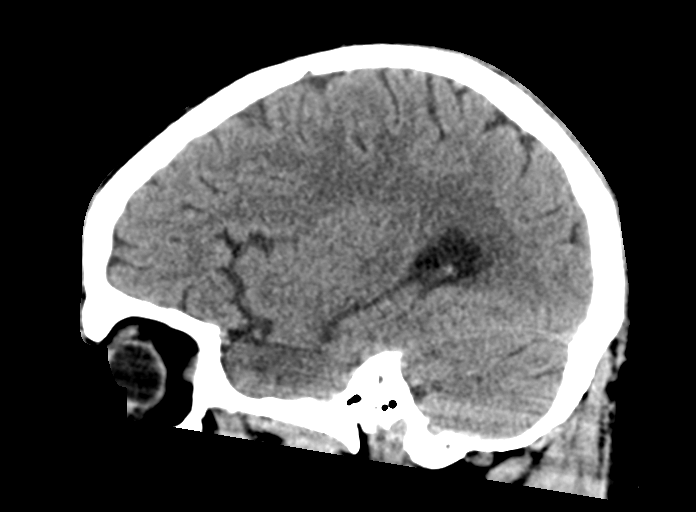

[15 of 46 positions shown; findings below may reference images not displayed]

FINDINGS: Brain: Ventricles are normal in size and configuration. Sulci appear
within normal limits as well. There is no evident intracranial mass,
hemorrhage, extra-axial fluid collection, or midline shift. There is
evidence of a prior small infarct in the posterior right parietal
lobe. There is mild patchy small vessel disease in the centra
semiovale bilaterally. No acute infarct is demonstrable.

Vascular: There is no appreciable hyperdense vessel. There is
calcification in each carotid siphon region.

Skull: The bony calvarium appears intact.

Sinuses/Orbits: There is mucosal thickening in several ethmoid air
cells bilaterally. Other visualized paranasal sinuses are clear.
There is rightward deviation of the nasal septum. Visualized orbits
appear symmetric bilaterally. Evidence of previous cataract
extractions bilaterally.

Other: Mastoid air cells are clear. Note that mastoids on the left
are rather hyperplastic.
IMPRESSION: Prior small infarct in the posterior right parietal lobe, stable.
Patchy periventricular small vessel disease is felt to be stable. No
acute infarct evident. No mass or hemorrhage.

Foci of arterial vascular calcification noted. There is mucosal
thickening in several ethmoid air cells. There is rightward
deviation of the nasal septum.

## 2022-10-15 ENCOUNTER — Ambulatory Visit
Admit: 2022-10-15 | Discharge: 2022-12-01 | Payer: PRIVATE HEALTH INSURANCE | Attending: Preventive Medicine/Occupational Environmental Medicine | Primary: Physician

## 2022-10-15 DIAGNOSIS — Z77098 Contact with and (suspected) exposure to other hazardous, chiefly nonmedicinal, chemicals: Secondary | ICD-10-CM

## 2022-10-15 MED ORDER — NAPROXEN SODIUM 220 MG TABLET: 220 mg | ORAL | Status: AC

## 2022-10-15 MED ORDER — IBUPROFEN 200 MG TABLET
200 mg | Freq: Four times a day (QID) | ORAL | Status: AC
Start: 2022-10-15 — End: ?

## 2022-10-15 MED ORDER — ACETAMINOPHEN 500 MG TABLET
500 | Freq: Four times a day (QID) | ORAL | 0.00 refills | 13.00000 days | Status: AC
Start: 2022-10-15 — End: ?

## 2022-10-15 NOTE — Progress Notes (Signed)
10/15/22. Brendan Mckay is here after 7 years for concerns about his prior exposures in 1999-2002 at work. 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 He has had a chest CT scan in 2018 that was negative for cancer. He had several comprehensive QME reports from Brendan Mckay, Brendan Mckay and [facility]rendan Mckay. I reviewed these reports and they conclude there was no evidence for chronic health effects from his exposure at [facility].    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   Employer:               [employer] x 3 years. Out of office building in [place]. December 1999-2002  Job duties:              Series of dip tanks - degreaser, etchant, impurity removal, aluminum anodizer, hexavalent chromium, nickel acetate sealer   Incident:                  He recalls flu-like sxs, headaches, itchy and burning skin. He filed a workers Engineer, manufacturing, with an [place] report that he said occurred while the company had cleaned up the conditions. He settled for a small amount of money.     He has had skin "cancers," bipolar disorder, Raynauds, fibromyalgia. He is treated by [facility] in [facility].      WORK STATUS     The patient is working on full duty.     EXPOSURE HISTORY     The primary exposure associated with this diagnosis is chromium, nickel and silica.     Past Medical History   No past medical history on file.           Current Outpatient Prescriptions:     citalopram (CELEXA) 20 mg tablet, Take 20 mg by mouth Daily., Disp: , Rfl:     cyclobenzaprine (FLEXERIL) 5 mg tablet, Take 5 mg by mouth 3 (three) times daily., Disp: , Rfl:     etodolac (LODINE) 400 mg tablet, Take 400 mg by mouth 2 (two) times daily., Disp: , Rfl:     gabapentin (NEURONTIN) 600 mg tablet, Take 600 mg by mouth 3 (three) times daily., Disp: , Rfl:     HYDROcodone-acetaminophen (NORCO)  10-325 mg tablet, Take 1 tablet by mouth every 6 (six) hours as needed for Pain., Disp: , Rfl:     NALTREXONE HCL (NALTREXONE ORAL), Take by mouth., Disp: , Rfl:     nortriptyline (PAMELOR) 50 mg capsule, Take 50 mg by mouth 2 (two) times daily., Disp: , Rfl:     OLANZapine (ZYPREXA) 10 mg tablet, Take 10 mg by mouth Daily., Disp: , Rfl:     sertraline (ZOLOFT) 50 mg tablet, Take 50 mg by mouth Daily., Disp: , Rfl:      Social History               Social History    Marital status: Single       Spouse name: N/A    Number of children: N/A    Years of education: N/A            Occupational History    Journalist, newspaper  Social History Main Topics    Smoking status: Current Every Day Smoker       Packs/day: 1.00       Years: 20.00       Start date: 08/17/1981    Smokeless tobacco: Never Used    Alcohol use 3.6 oz/week       6 Cans of beer per week    Drug use: Unknown    Sexual activity: Not on file           Other Topics Concern    Not on file          Social History Narrative    No narrative on file            Family History   No family history on file.        No Known Allergies     Past Surgical History         Past Surgical History:   Procedure Laterality Date    LIPOMA RESECTION        palate                REVIEW OF SYSTEMS     I have reviewed the following symptoms and except as noted in bold are negative:     GENERAL: No fevers or chills. HEENT: No change in vision, no earache, sore throat or sinus congestion. CARDIOVASCULAR: No chest pain or pressure. No palpitations. PULMONARY: shortness of breath, cough and wheeze. GASTROINTESTINAL: No abdominal pain, nausea, vomiting or diarrhea, melena or bright red blood per rectum. GENITOURINARY: No urinary frequency, urgency, hesitancy or dysuria.  DERMATOLOGIC: No rash, no itching, no lesions. ENDOCRINE: No polyuria, polydipsia, no heat or cold intolerance. No recent change in weight. HEMATOLOGICAL: No easy bruising or bleeding. NEUROLOGIC:  No headache, seizures, numbness, tingling or weakness. PSYCHIATRIC: No depression, no loss of interest in normal activity or change in sleep pattern.      PHYSICAL EXAMINATION  BP 148/85   Pulse 106   Temp 37.1 C (98.7 F)   Ht 170.2 cm (5' 7"$ )   Wt 78.5 kg (173 lb)   SpO2 97%   BMI 27.10 kg/m      SKIN: normal color, texture and turgor.No lesions, rashes, petechiae, or eruptions.     REVIEW OF RECORDS/LABORATORY DATA     Jan 09, 2008 - urine arsenic 19 (normal)  Jan 07, 2008 - blood nickel 4.0 (normal)  Jan 17, 2008 - blood molybdenum, chromium, aluminum - normal    DATE OF SERVICE: 09/23/2022  Brendan Mend, MD (Oncology)    HISTORY OF THE PRESENT ILLNESS:   62 y.o. M referred to heme for elevated WBC. Recently has been undergoing workup with Brendan Mckay (GI) for abdominal pain and rule out porphyria. Studies were repeated and found to be non-diagnostic for porphyria.   He reports no fever, rigors, night sweats, no weight loss   His primary concerns are his abdominal pain which no solution has been found. He also has loss of teeth which have been attributed to chemical exposures previously from work related exposure   Patient presents for heme f/up to review labs. He has been doing okay. Spent the holidays in [place] with family and friends.     LABS REVIEWED DETAILED   Ancillary Orders on 08/26/2022   Component Date Value Ref Range Status   WBC 09/08/2022 14.1 (H) 3.4 - 10.8 x10E3/uL Final   RBC 09/08/2022 5.04 4.14 - 5.80 x10E6/uL Final   Hemoglobin  09/08/2022 16.9 13.0 - 17.7 g/dL Final   Hematocrit 09/08/2022 49.0 37.5 - 51.0 % Final   MCV 09/08/2022 97 79 - 97 fL Final   MCH 09/08/2022 33.5 (H) 26.6 - 33.0 pg Final   MCHC 09/08/2022 34.5 31.5 - 35.7 g/dL Final   RDW 09/08/2022 12.3 11.6 - 15.4 % Final   Platelets 09/08/2022 221 150 - 450 x10E3/uL Final   % Neutrophils 09/08/2022 73 Not Estab. % Final   % Lymphocytes 09/08/2022 17 Not Estab. % Final   % Monocytes 09/08/2022 8 Not Estab. % Final   % Eosinophils  09/08/2022 1 Not Estab. % Final   Basos 09/08/2022 1 Not Estab. % Final   Immature Cells 09/08/2022 CANCELED Final   Neutrophils (Absolute) 09/08/2022 10.3 (H) 1.4 - 7.0 x10E3/uL Final   Lymphs (Absolute) 09/08/2022 2.5 0.7 - 3.1 x10E3/uL Final   Monocytes(Absolute) 09/08/2022 1.1 (H) 0.1 - 0.9 x10E3/uL Final   Eos (Absolute) 09/08/2022 0.1 0.0 - 0.4 x10E3/uL Final   Baso (Absolute) 09/08/2022 0.1 0.0 - 0.2 x10E3/uL Final   Immature Granulocytes 09/08/2022 0 Not Estab. % Final   Immature Grans (Abs) 09/08/2022 0.0 0.0 - 0.1 x10E3/uL Final   Hematology Comments: 09/08/2022 CANCELED Final   LDH 09/08/2022 223 121 - 224 IU/L Final   Sedimentation Rate-Westergren 09/08/2022 4 0 - 30 mm/hr Final   Immunoglobulin G, Qn, Serum 09/08/2022 829 603 - 1,613 mg/dL Final   Immunoglobulin A, Qn, Serum 09/08/2022 179 90 - 386 mg/dL Final   Immunoglobulin M, Qn, Serum 09/08/2022 107 20 - 172 mg/dL Final   MPL RESULT 09/08/2022 Comment Final   CALR RESULT 09/08/2022 Comment Final   Free Kappa Lt Chains,S 09/08/2022 9.1 3.3 - 19.4 mg/L Final   Free Lambda Lt Chains,S 09/08/2022 7.7 5.7 - 26.3 mg/L Final   KAPPA/LAMBDA RATIO,S 09/08/2022 1.18 0.26 - 1.65 Final   Protein, Total, Serum 09/08/2022 6.8 6.0 - 8.5 g/dL Final   Albumin 09/08/2022 3.8 2.9 - 4.4 g/dL Final   Alpha-1-Globulin 09/08/2022 0.2 0.0 - 0.4 g/dL Final   Alpha-2-Globulin 09/08/2022 0.6 0.4 - 1.0 g/dL Final   Beta Globulin 09/08/2022 1.2 0.7 - 1.3 g/dL Final   Gamma Globulin 09/08/2022 1.0 0.4 - 1.8 g/dL Final   M-Spike 09/08/2022 Not Observed Not Observed g/dL Final   Globulin, Total 09/08/2022 3.0 2.2 - 3.9 g/dL Final   A/G RATIO 09/08/2022 1.3 0.7 - 1.7 Final   Please Note: 09/08/2022 Comment Final   PDF 09/08/2022 . Final   JAK2 V617F RESULT 09/08/2022 Comment Final   REFLEX: 09/08/2022 Comment Final   V617F RFX CALR/MPL BACKGROUND 09/08/2022 Comment Final   07/10/22  WBC 12  ANC 8   HGB 17  HCT 49.7   PLTS 255   10/28/21   WBC 11.1     HEME/ONC ASSESSEMENT AND  PLAN:  62 y.o. male with:  Elevated WBC, 11-12; r/o BM pathology process as patient has nonspecific vague symptoms  - recent CBC on 08/2022: WBC 14; ANC 10, monocytes 1   Negative LDH, negative ESR; negative jak2/mpl/calr   Chronic abdominal pain   - urine prophyria relatively normal   - suspicion for porphyria is low   Active tobacco abuse; 50PPD hx   HTN   Fibromyalgia     Plan:  Reviewed lans and findings with patient   Reviewed all: CBC, LDH, ESR, Jak2   Will order BCR/ABL and flow cytometry given continued increased in monocytes and neutrophils   Although porphyria is low  on differential will plan to order the following: ALA D spot urine testing and serum porphyrins as no other explanation for his abdominal pain has been found (provided lab printouts again to patient)   Has functional medicine appt on 10/14/22 at [facility] in 3-4 weeks to review lab work     Component  Ref Range & Units 10/02/22 1258   PRIOR RESULT NG   SOURCE: BLOOD   BCR ABL1/ABL1 %  0.000 0.000   BCR ABL1/ABL1 % (IS)  0.000 0.000     Component 10/02/22 1258   P190 BCR ABL1 NOT DETECTED       Component 10/02/22 1258   P210 BCR ABL1 NOT DETECTED    Component  Ref Range & Units 10/02/22 1258   TOTAL PORPHYRINS  1.0 - 5.6 mcg/L 1.4               1. Chemical exposure            RECOMMENDATIONS     I spent a total of 30 minutes face-to-face with the patient and 30 minutes of that time was spent counseling reviewing his medical and occupational hx. Mr. Itzkowitz probably had exposure to the carcinogens chromium VI, nickel and silica between 123XX123 and 2002, and is at elevated risk of lung cancer. I reviewed the additional records and his risk is elevated for lung cancer. He has smoked cigarettes for many years and of course this compounds his risk of lung cancer.     1. Low dose spiral CT scan (early lung cancer detection) every 3 years  2. Full PFTs including diffusion capacity, pre/post bronchodilators  3. Strongly encourage smoking cessation     Consult notes  sent to Brendan. Sherrine Maples  TEL [phone]

## 2023-01-04 ENCOUNTER — Ambulatory Visit
Admit: 2023-01-04 | Discharge: 2023-01-04 | Payer: PRIVATE HEALTH INSURANCE | Attending: Gastroenterology | Primary: Physician

## 2023-01-04 DIAGNOSIS — R1084 Generalized abdominal pain: Secondary | ICD-10-CM

## 2023-01-04 NOTE — Patient Instructions (Signed)
-   you are not experiencing acute porphyria attacks and you do not carry any mutations in porphyria causing genes

## 2023-01-04 NOTE — Progress Notes (Unsigned)
I had the pleasure of seeing Brendan Mckay today at the [facility] in consultation for acute hepatic porphyria.    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 His symptoms began around 2010 with severe episodic abdominal pain on the left side of his abdomen. The pain comes on in minutes to hours and lasts several hours to several days. Initially the episodes occurred once every week, but now occurs nearly daily. When the abdominal pain first presented, he also had blistering skin lesions and porphyria was worked up. However his urine porphyrin precursors ALA and PBG were consistently normal. His skin lesions were eventually attributed to work sponsor to chemicals as he worked in [employer] and went away after the exposure stopped. His abdominal pain episodes have continued but repeated urine ALA were only mildly elevated while PBG were normal. Recently he underwent genetic testing with the [employer] 10 gene porphyria panel and no mutations were found.     No past medical history on file.    Past Surgical History:   Procedure Laterality Date    LIPOMA RESECTION      palate         Current Outpatient Medications   Medication Sig Dispense Refill    acetaminophen (TYLENOL) 500 mg tablet Take 2 tablets (1,000 mg total) by mouth every 6 (six) hours      ibuprofen (ADVIL,MOTRIN) 200 mg tablet Take 1 tablet (200 mg total) by mouth every 6 (six) hours      citalopram (CELEXA) 20 mg tablet Take 20 mg by mouth Daily. (Patient not taking: Reported on 10/15/2022)      cyclobenzaprine (FLEXERIL) 5 mg tablet Take 5 mg by mouth 3 (three) times daily. (Patient not taking: Reported on 10/15/2022)      etodolac (LODINE) 400 mg tablet Take 400 mg by mouth 2 (two) times daily. (Patient not taking: Reported on 10/15/2022)      gabapentin (NEURONTIN) 600 mg tablet Take 600 mg by mouth 3 (three) times  daily. (Patient not taking: Reported on 10/15/2022)      HYDROcodone-acetaminophen (NORCO) 10-325 mg tablet Take 1 tablet by mouth every 6 (six) hours as needed for Pain. (Patient not taking: Reported on 10/15/2022)      NALTREXONE HCL (NALTREXONE ORAL) Take by mouth. (Patient not taking: Reported on 10/15/2022)      naproxen sodium (ANAPROX) 220 mg tablet Take 1 tablet (220 mg total) by mouth every 12 (twelve) hours (Patient not taking: Reported on 01/04/2023)      nortriptyline (PAMELOR) 50 mg capsule Take 50 mg by mouth 2 (two) times daily. (Patient not taking: Reported on 10/15/2022)      OLANZapine (ZYPREXA) 10 mg tablet Take 10 mg by mouth Daily. (Patient not taking: Reported on 10/15/2022)      sertraline (ZOLOFT) 50 mg tablet Take 50 mg by mouth Daily. (Patient not taking: Reported on 10/15/2022)       No current facility-administered medications for this visit.       Allergies: He has No Known Allergies.    Social History:  Social History     Socioeconomic History    Marital status: Single     Spouse name: Not on file    Number of children: Not on file    Years of education: Not on file    Highest education level: Not on file  Occupational History    Occupation: [employer] fast streamer     Comment: 1999-present   Tobacco Use    Smoking status: Every Day     Packs/day: 1.00     Years: 20.00     Additional pack years: 0.00     Total pack years: 20.00     Types: Cigarettes     Start date: 08/17/1981    Smokeless tobacco: Never   Substance and Sexual Activity    Alcohol use: Yes     Alcohol/week: 6.0 standard drinks of alcohol     Types: 6 Cans of beer per week    Drug use: Not on file    Sexual activity: Not on file   Other Topics Concern    Not on file   Social History Narrative    Not on file     Social Determinants of Health     Financial Resource Strain: Not on file   Food Insecurity: Not on file   Transportation Needs: Not on file   Housing Stability: Not on file       Family History: His family history is not on  file.    Review of Systems:  Constitutional: No fever or weight loss; normal sleep and appetite.  Eye problems: normal  Ear/Nose/Throat: normal  Respiratory: No shortness of breath; no cough  Heart: Normal exercise capacity  GI:  See HPI  Kidney/Bladder: normal  Neurological: normal  Skin: No rash or itching  Psychological: No history of depression.  Musculoskeletal: No weakness or muscle loss   Endocrine: normal  Blood/Lymph: normal  All other systems were reviewed and are negative    Physical Exam:  Vital Signs: BP (!) 144/78 (BP Location: Left upper arm, Patient Position: Sitting, Cuff Size: Adult)   Pulse 84   Temp 36 C (96.8 F) (Temporal)   Ht 170.2 cm (5' 7.01")   Wt 77.8 kg (171 lb 8 oz)   SpO2 97%   BMI 26.85 kg/m   Constitutional: Pleasant, well-appearing, alert  Eyes: Sclera anicteric  Ears, Nose, Mouth, Throat: Moist mucous membranes, no oral lesions  Respiratory: Clear lungs to auscultation bilaterally, breathing not labored  Cardiovascular: Regular rate and rhythm, normal S1 and S2, no murmurs, rubs or gallops. No lower extremity edema  Gastrointestinal: Soft, non-distended, non-tender. No obvious masses. No appreciable hepatosplenomegaly  Hem/Lymphatic: No cervical or supraclavicular lymphadenopathy  Musculoskeletal: No clubbing or cyanosis of hands  Skin: No cutaneous stigmata of chronic liver disease, no rashes.  Neurologic: No asterixis.  Psychiatric: Alert and oriented to person, place and time. Normal affect.    Records Review: I have personally reviewed records from [facility]. These are summarized within the history of present illness.    Labs: I have personally reviewed and interpreted the following laboratory studies:   Labs:  No results found for: "NA", "K", "CL", "CO2", "BUN", "GLU", "ALB", "TP", "CA", "ALT", "AST", "AFP"  No results found for: "WBC", "RBC", "HGB", "HCT", "MCV", "MCH", "MCHC", "RDW", "PLT", "MPV" No results found for: "PT", "INR"  No results found for:  "ANAT", "ANAP", "LKM", "AMAT", "HBSAG", "FE", "MG", "TSH", "HBEAG", "HBEAB", "IGG", "IGM"  No results found for: "HBCAB", "HBSAB", "HBSAG"    Outside Labs:    I have reviewed the following studies:       ASSESSMENT AND PLAN:  Brendan Mckay is a 62 y.o. male with a history of ***        An After Visit Summary was printed and given to  the patient.     A copy of this consultation letter was faxed/routed to Dr. Chales Abrahams.
